# Patient Record
Sex: Female | Born: 1935 | Race: White | Hispanic: No | Smoking: Current every day smoker
Health system: Southern US, Community
[De-identification: ages and names within clinical notes are randomized; demographics above are authoritative.]

## PROBLEM LIST (undated history)

## (undated) DIAGNOSIS — M549 Dorsalgia, unspecified: Secondary | ICD-10-CM

## (undated) DIAGNOSIS — K7460 Unspecified cirrhosis of liver: Secondary | ICD-10-CM

## (undated) DIAGNOSIS — I10 Essential (primary) hypertension: Secondary | ICD-10-CM

---

## 2001-11-08 ENCOUNTER — Encounter: Payer: Self-pay | Admitting: Family Medicine

## 2001-11-08 ENCOUNTER — Ambulatory Visit (HOSPITAL_COMMUNITY): Admission: RE | Admit: 2001-11-08 | Discharge: 2001-11-08 | Payer: Self-pay | Admitting: Family Medicine

## 2001-11-30 ENCOUNTER — Ambulatory Visit (HOSPITAL_COMMUNITY): Admission: RE | Admit: 2001-11-30 | Discharge: 2001-11-30 | Payer: Self-pay | Admitting: Internal Medicine

## 2002-06-08 ENCOUNTER — Ambulatory Visit (HOSPITAL_COMMUNITY): Admission: RE | Admit: 2002-06-08 | Discharge: 2002-06-08 | Payer: Self-pay | Admitting: Family Medicine

## 2002-06-08 ENCOUNTER — Encounter: Payer: Self-pay | Admitting: Family Medicine

## 2003-05-03 ENCOUNTER — Encounter: Payer: Self-pay | Admitting: Family Medicine

## 2003-05-03 ENCOUNTER — Ambulatory Visit (HOSPITAL_COMMUNITY): Admission: RE | Admit: 2003-05-03 | Discharge: 2003-05-03 | Payer: Self-pay | Admitting: Family Medicine

## 2003-05-16 ENCOUNTER — Encounter: Payer: Self-pay | Admitting: Family Medicine

## 2003-05-16 ENCOUNTER — Ambulatory Visit (HOSPITAL_COMMUNITY): Admission: RE | Admit: 2003-05-16 | Discharge: 2003-05-16 | Payer: Self-pay | Admitting: Family Medicine

## 2003-05-25 ENCOUNTER — Other Ambulatory Visit: Admission: RE | Admit: 2003-05-25 | Discharge: 2003-05-25 | Payer: Self-pay | Admitting: Obstetrics & Gynecology

## 2004-07-24 ENCOUNTER — Ambulatory Visit (HOSPITAL_COMMUNITY): Admission: RE | Admit: 2004-07-24 | Discharge: 2004-07-24 | Payer: Self-pay | Admitting: Obstetrics & Gynecology

## 2005-07-17 ENCOUNTER — Ambulatory Visit (HOSPITAL_COMMUNITY): Admission: RE | Admit: 2005-07-17 | Discharge: 2005-07-17 | Payer: Self-pay | Admitting: Family Medicine

## 2005-12-15 ENCOUNTER — Ambulatory Visit (HOSPITAL_COMMUNITY): Admission: RE | Admit: 2005-12-15 | Discharge: 2005-12-15 | Payer: Self-pay | Admitting: Family Medicine

## 2005-12-15 ENCOUNTER — Encounter: Payer: Self-pay | Admitting: Orthopedic Surgery

## 2005-12-16 ENCOUNTER — Ambulatory Visit (HOSPITAL_COMMUNITY): Admission: RE | Admit: 2005-12-16 | Discharge: 2005-12-16 | Payer: Self-pay | Admitting: Family Medicine

## 2005-12-18 ENCOUNTER — Ambulatory Visit (HOSPITAL_COMMUNITY): Admission: RE | Admit: 2005-12-18 | Discharge: 2005-12-18 | Payer: Self-pay | Admitting: Family Medicine

## 2006-10-05 ENCOUNTER — Ambulatory Visit (HOSPITAL_COMMUNITY): Admission: RE | Admit: 2006-10-05 | Discharge: 2006-10-05 | Payer: Self-pay | Admitting: Family Medicine

## 2007-03-30 ENCOUNTER — Emergency Department (HOSPITAL_COMMUNITY): Admission: EM | Admit: 2007-03-30 | Discharge: 2007-03-30 | Payer: Self-pay | Admitting: Emergency Medicine

## 2007-05-31 ENCOUNTER — Emergency Department (HOSPITAL_COMMUNITY): Admission: EM | Admit: 2007-05-31 | Discharge: 2007-05-31 | Payer: Self-pay | Admitting: Emergency Medicine

## 2007-08-15 ENCOUNTER — Encounter: Payer: Self-pay | Admitting: Orthopedic Surgery

## 2007-08-15 ENCOUNTER — Ambulatory Visit (HOSPITAL_COMMUNITY): Admission: RE | Admit: 2007-08-15 | Discharge: 2007-08-15 | Payer: Self-pay | Admitting: Family Medicine

## 2007-08-30 ENCOUNTER — Encounter: Payer: Self-pay | Admitting: Orthopedic Surgery

## 2007-09-12 ENCOUNTER — Ambulatory Visit: Payer: Self-pay | Admitting: Orthopedic Surgery

## 2007-09-12 DIAGNOSIS — M549 Dorsalgia, unspecified: Secondary | ICD-10-CM | POA: Insufficient documentation

## 2007-09-12 DIAGNOSIS — M5430 Sciatica, unspecified side: Secondary | ICD-10-CM

## 2007-09-12 DIAGNOSIS — E119 Type 2 diabetes mellitus without complications: Secondary | ICD-10-CM

## 2007-09-12 DIAGNOSIS — M545 Low back pain, unspecified: Secondary | ICD-10-CM | POA: Insufficient documentation

## 2007-09-12 DIAGNOSIS — Z8679 Personal history of other diseases of the circulatory system: Secondary | ICD-10-CM | POA: Insufficient documentation

## 2007-09-13 ENCOUNTER — Telehealth: Payer: Self-pay | Admitting: Orthopedic Surgery

## 2007-09-16 ENCOUNTER — Encounter: Payer: Self-pay | Admitting: Orthopedic Surgery

## 2007-09-22 ENCOUNTER — Ambulatory Visit: Payer: Self-pay | Admitting: Orthopedic Surgery

## 2007-09-27 ENCOUNTER — Telehealth: Payer: Self-pay | Admitting: Orthopedic Surgery

## 2007-10-26 ENCOUNTER — Encounter: Payer: Self-pay | Admitting: Orthopedic Surgery

## 2008-03-15 ENCOUNTER — Ambulatory Visit (HOSPITAL_COMMUNITY): Admission: RE | Admit: 2008-03-15 | Discharge: 2008-03-15 | Payer: Self-pay | Admitting: Family Medicine

## 2008-11-24 ENCOUNTER — Emergency Department (HOSPITAL_COMMUNITY): Admission: EM | Admit: 2008-11-24 | Discharge: 2008-11-24 | Payer: Self-pay | Admitting: Emergency Medicine

## 2008-11-24 ENCOUNTER — Encounter: Payer: Self-pay | Admitting: Orthopedic Surgery

## 2008-11-28 ENCOUNTER — Ambulatory Visit: Payer: Self-pay | Admitting: Orthopedic Surgery

## 2008-11-28 DIAGNOSIS — S8263XA Displaced fracture of lateral malleolus of unspecified fibula, initial encounter for closed fracture: Secondary | ICD-10-CM | POA: Insufficient documentation

## 2008-11-30 ENCOUNTER — Encounter: Payer: Self-pay | Admitting: Orthopedic Surgery

## 2009-01-09 ENCOUNTER — Ambulatory Visit: Payer: Self-pay | Admitting: Orthopedic Surgery

## 2009-02-20 ENCOUNTER — Ambulatory Visit: Payer: Self-pay | Admitting: Orthopedic Surgery

## 2009-02-20 DIAGNOSIS — M722 Plantar fascial fibromatosis: Secondary | ICD-10-CM

## 2009-02-20 DIAGNOSIS — Q6650 Congenital pes planus, unspecified foot: Secondary | ICD-10-CM

## 2009-02-21 ENCOUNTER — Ambulatory Visit (HOSPITAL_COMMUNITY): Admission: RE | Admit: 2009-02-21 | Discharge: 2009-02-21 | Payer: Self-pay | Admitting: Family Medicine

## 2009-07-26 ENCOUNTER — Ambulatory Visit (HOSPITAL_COMMUNITY): Admission: RE | Admit: 2009-07-26 | Discharge: 2009-07-26 | Payer: Self-pay | Admitting: Family Medicine

## 2009-11-12 ENCOUNTER — Ambulatory Visit (HOSPITAL_COMMUNITY): Admission: RE | Admit: 2009-11-12 | Discharge: 2009-11-12 | Payer: Self-pay | Admitting: Family Medicine

## 2010-09-28 ENCOUNTER — Encounter: Payer: Self-pay | Admitting: Family Medicine

## 2010-10-09 ENCOUNTER — Encounter (HOSPITAL_COMMUNITY): Payer: Self-pay

## 2010-10-09 ENCOUNTER — Other Ambulatory Visit (HOSPITAL_COMMUNITY): Payer: Self-pay | Admitting: Family Medicine

## 2010-10-09 ENCOUNTER — Ambulatory Visit (HOSPITAL_COMMUNITY)
Admission: RE | Admit: 2010-10-09 | Discharge: 2010-10-09 | Disposition: A | Discharge status: Home / Self Care | Payer: Medicare Other | Source: Ambulatory Visit | Attending: Family Medicine | Admitting: Family Medicine

## 2010-10-09 DIAGNOSIS — M5137 Other intervertebral disc degeneration, lumbosacral region: Secondary | ICD-10-CM | POA: Insufficient documentation

## 2010-10-09 DIAGNOSIS — M79609 Pain in unspecified limb: Secondary | ICD-10-CM | POA: Insufficient documentation

## 2010-10-09 DIAGNOSIS — G8929 Other chronic pain: Secondary | ICD-10-CM

## 2010-10-09 DIAGNOSIS — M899 Disorder of bone, unspecified: Secondary | ICD-10-CM | POA: Insufficient documentation

## 2010-10-09 DIAGNOSIS — M545 Low back pain, unspecified: Secondary | ICD-10-CM | POA: Insufficient documentation

## 2010-10-09 DIAGNOSIS — M51379 Other intervertebral disc degeneration, lumbosacral region without mention of lumbar back pain or lower extremity pain: Secondary | ICD-10-CM | POA: Insufficient documentation

## 2010-10-09 HISTORY — DX: Essential (primary) hypertension: I10

## 2011-01-23 NOTE — Op Note (Signed)
[facility]  Patient:    Melanie Fields, Melanie Fields Visit Number: [id number] MRN: [id number]          Service Type: END Location: DAY Attending Physician:  Jonathon Bellows Dictated by:   Roetta Sessions, M.D. Proc. Date: 11/30/01 Admit Date:  11/30/2001   CC:         Butch Penny, M.D.   Operative Report  PROCEDURE:  Colonoscopy and snare polypectomy with biopsy injection therapy.  ENDOSCOPIST:  Roetta Sessions, M.D.  INDICATIONS:  The patient is a 77 year old lady who has never had her lower GI tract imaged and now comes for colonoscopy.  She has chronic right lower quadrant abdominal pain, etiology of which has not been determined.  This procedure has been discussed with her previously.  The potential risks, benefits and alternatives have been reviewed and questions answered.  She is agreeable.  Please see my dictation for more information.  The CBC and urinalysis were normal recently through the office.  The patient is low risk for conscious sedation.  DESCRIPTION OF PROCEDURE:  Oxygen saturation, blood pressure, pulse and respirations were monitored throughout the entire procedure.  Conscious sedation with Versed 6 mg IV in divided doses and Demerol 125 mg IV in divided doses.  The instrument was a pediatric colonoscope and Olympus adult colonoscope.  The pediatric scope was the only one available initially given the number of procedures that were being done today.  Digital rectal examination revealed no abnormalities.  Endoscopic findings: The prep was adequate.  Examination of the rectal mucosa including a retroflexed view of the anal verge revealed a 1 cm polyp at 5 cm and she had a second 1 cm polyp at the rectosigmoid junction at 25 cm.  She had numerous other 0.5 cm and 1 cm polyps throughout the descending and right colon.  There was quite a bit of redundancy of the colon.  I initially utilized the pediatric colonoscope but then felt the adult scope  would be better because of a higher degree of stiffness in reaching the cecum.  I withdrew the pediatric colonoscope and placed the adult scope in the rectum and advanced the scope without much difficulty all the way to the cecum.  The cecum, ileocecal valve, appendiceal valve and orifice were well seen and photographed.  There were two diminutive polyps in the cecum which were cold biopsied/removed.  There were multiple 0.5 to 1 cm polyps in the right colon around the hepatic flexure and descending colon, rectosigmoid and rectum which were removed with snare cautery and recovered.  The polyp at 25 cm did want to ooze after it was resected.  I touched it up with the tip of the cautery unit.  It wanted to bleed initially.  I injected a total of 4 cc of 1:10,000 epinephrine to this area, washed it and applied snare cautery to a couple of small oozing sites. I observed this polypectomy site for a good five minutes afterwards and there appeared to be good hemostasis.  The patient overall tolerated the somewhat prolonged procedure very well and was reactive in endoscopy.  IMPRESSION: 1. Pancolonic polyps and rectal polyps as described above, resected and/or    biopsied as described above.  Polyp at 25 cm oozed post polypectomy    requiring injection therapy and additional cauterization. 2. No endoscopic explanation for the patients right lower quadrant abdominal    pain.  She may ultimately come to laparoscopy with incidental appendectomy.  RECOMMENDATIONS: 1. She was admonished not  to take any aspirin or arthritis medications    for the next 10 days. 2. Colace 100 mg orally b.i.d. for the next five days. 3. If she has any rectal bleeding she is to let me know. 4. Follow up on pathology.  Further recommendations to follow. Dictated by:   Roetta Sessions, M.D. Attending Physician:  Jonathon Bellows DD:  11/30/01 TD:  12/01/01 Job: [id number]

## 2011-01-23 NOTE — Consult Note (Signed)
[facility]  Patient:    CHANYA, CHRISLEY Visit Number: [id number] MRN: [id number]          Service Type: END Location: DAY Attending Physician:  Jonathon Bellows Dictated by:   Roetta Sessions, M.D. Proc. Date: 11/21/01 Admit Date:  11/30/2001 Discharge Date: 11/30/2001   CC:         Butch Penny, M.D.   Consultation Report  DATE OF BIRTH:  July 02, 1934  REASON FOR CONSULTATION:  Right lower quadrant and right flank pain, abnormal CT.  HISTORY OF PRESENT ILLNESS:  Ms. Quorra Rosene is a pleasant 77 year old lady referred at the courtesy of Dr. Butch Penny for a one-year history of right lower quadrant and right flank pain.  She noted it approximately one year ago and has been intermittent ever since, not effected by eating or by having a bowel movement.  She tells me that the pain sometimes radiates from her right back into the front.  She has not had any nausea or vomiting.  Has not lost any weight.  There is no melena or rectal bleeding.  A CT scan from November 08, 2001, demonstrated nonspecific periportal and celiac adenopathy, of uncertain etiology.  There was a small right renal cyst and two left adrenal nodules.  A CT of the pelvis demonstrated a normal-appearing appendix.  No evidence of nephrolithiasis.  A small cyst in the right ovary.  The remainder of the pelvis appeared to be normal.  She tells me that the pain sometimes is worse in the right lower quadrant and right flank after she stands or walks for a prolonged period of time.  She tells me she has had repeatedly normal urinalyses in Dr. Renard Matter office.  She denies any reflux symptoms.  No odynophagia, no dysphagia, or early satiety, nausea, or vomiting.  PAST MEDICAL HISTORY 1. Significant for hypertension. 2. Anxiety. 3. Neurosis.  PAST SURGICAL HISTORY:  None.  CURRENT MEDICATIONS 1. Atenolol 50 mg p.o. q.d. 2. Lortab 5/500, one q.i.d. p.r.n. 3. Ativan 1 mg  p.r.n.  ALLERGIES:  PENICILLIN AND SULFA.  FAMILY HISTORY:  Father died with a myocardial infarction.  Mother died with a brain tumor.  No history of GI neoplasia, or other chronic gastrointestinal illnesses.  SOCIAL HISTORY:  The patient has been married for 50 years.  She has one daughter.  She is retired.  She smokes 1/2 pack of cigarettes per day.  She does not use alcohol.  REVIEW OF SYSTEMS:  Denies a rash.  No chest pain or dyspnea on exertion.  No fever or chills.  PHYSICAL EXAMINATION  GENERAL:  A pleasant 77 year old lady resting comfortably.  Weight 207-1/2 pounds, height 5 feet 3 inches.  VITAL SIGNS:  Temperature 98.3 degrees, blood pressure 156/90, pulse 76.  SKIN:  Warm and dry.  HEENT:  There is no jaundice.  No cutaneous stigmata of chronic liver disease. No scleral icterus.  Conjunctivae are pink.  Oral cavity:  Edentulous.  She has plates in place.  No lesions.  NECK:  Jugular venous distention is not prominent.  CHEST:  Lungs are clear to auscultation.  CARDIAC:  A regular rate and rhythm without murmur, gallop, or rub.  BREASTS:  Examination is deferred.  ABDOMEN:  Nondistended, obese, with positive bowel sounds.  Does have fullness in the right upper quadrant.  No discrete mass or organomegaly.  She does have some right lower quadrant tenderness to palpation.  There is no rebound tenderness.  EXTREMITIES:  No edema.  RECTAL:  Small hemorrhoidal tag, good sphincter tone.  Soft brown stool in the rectal vault,  Hemoccult-negative.  ASSESSMENT:  Ms. Osa Fogarty is a pleasant 77 year old lady with a nearly one-year history of right lower quadrant abdominal pain.  There is some radiation from the right flank into the right lower quadrant.  Prior urinalyses have been negative.  A CT demonstrated some mild adenopathy, which is quite nonspecific and of uncertain significance at this time.  The symptoms have been ongoing too long for this to be herpes  zoster infection. Degenerative joint disease of the spine, producing radicular pain, remains in the differential, at least a contributing factor.  An occult lesion such as a spigelian hernia also remains in the differential. The appendix appear normal on CT scan.  A small cyst in the right ovary is not likely, accounting for all of her symptoms.  RECOMMENDATION:  This lady really needs to have her colon imaged.  She is far overdue, and of course a screening, to further evaluate her symptoms. Will check a urinalysis, CBC, and sedimentation rate.  Will proceed with a colonoscopy.  I have discussed this approach with Ms. Proffit.  The potential risks, benefits, and alternatives have been reviewed.  She is agreeable for conscious sedation.  She may end up getting an MRI of her spine as well. Further recommendations to follow.  I would like to thank Dr. Butch Penny for allowing me to see this nice lady today in consultation. Dictated by:   Roetta Sessions, M.D. Attending Physician:  Jonathon Bellows DD:  11/21/01 TD:  11/21/01 Job: [id number]

## 2011-04-04 ENCOUNTER — Emergency Department (HOSPITAL_COMMUNITY): Payer: Medicare Other

## 2011-04-04 ENCOUNTER — Emergency Department (HOSPITAL_COMMUNITY)
Admission: EM | Admit: 2011-04-04 | Discharge: 2011-04-04 | Disposition: A | Discharge status: Home / Self Care | Payer: Medicare Other | Attending: Emergency Medicine | Admitting: Emergency Medicine

## 2011-04-04 ENCOUNTER — Encounter (HOSPITAL_COMMUNITY): Payer: Self-pay | Admitting: *Deleted

## 2011-04-04 DIAGNOSIS — F17200 Nicotine dependence, unspecified, uncomplicated: Secondary | ICD-10-CM | POA: Insufficient documentation

## 2011-04-04 DIAGNOSIS — E119 Type 2 diabetes mellitus without complications: Secondary | ICD-10-CM | POA: Insufficient documentation

## 2011-04-04 DIAGNOSIS — M549 Dorsalgia, unspecified: Secondary | ICD-10-CM | POA: Insufficient documentation

## 2011-04-04 DIAGNOSIS — G8929 Other chronic pain: Secondary | ICD-10-CM | POA: Insufficient documentation

## 2011-04-04 DIAGNOSIS — R05 Cough: Secondary | ICD-10-CM | POA: Insufficient documentation

## 2011-04-04 DIAGNOSIS — R059 Cough, unspecified: Secondary | ICD-10-CM | POA: Insufficient documentation

## 2011-04-04 DIAGNOSIS — Z79899 Other long term (current) drug therapy: Secondary | ICD-10-CM | POA: Insufficient documentation

## 2011-04-04 DIAGNOSIS — I10 Essential (primary) hypertension: Secondary | ICD-10-CM | POA: Insufficient documentation

## 2011-04-04 HISTORY — DX: Unspecified cirrhosis of liver: K74.60

## 2011-04-04 HISTORY — DX: Dorsalgia, unspecified: M54.9

## 2011-04-04 LAB — URINALYSIS, ROUTINE W REFLEX MICROSCOPIC
Bilirubin Urine: NEGATIVE
Glucose, UA: NEGATIVE mg/dL
Hgb urine dipstick: NEGATIVE
Specific Gravity, Urine: <1.005 — ABNORMAL LOW (ref 1.005–1.030)
pH: 6.5 (ref 5.0–8.0)

## 2011-04-04 MED ORDER — OXYCODONE HCL 5 MG PO TABS
10.0000 mg | ORAL_TABLET | Freq: Once | ORAL | Status: AC
  Administered 2011-04-04: 10 mg via ORAL
  Filled 2011-04-04: qty 2

## 2011-04-04 MED ORDER — TETANUS-DIPHTH-ACELL PERTUSSIS 5-2.5-18.5 LF-MCG/0.5 IM SUSP
0.5000 mL | Freq: Once | INTRAMUSCULAR | Status: AC
  Administered 2011-04-04: 0.5 mL via INTRAMUSCULAR
  Filled 2011-04-04: qty 0.5

## 2011-04-04 MED ORDER — OXYCODONE HCL 5 MG PO CAPS: 5.0000 mg | ORAL_CAPSULE | ORAL | Status: AC | PRN

## 2011-04-04 NOTE — ED Notes (Signed)
C/o pain in right lower quadrant, area is firm to touch and very tender

## 2011-04-04 NOTE — ED Notes (Signed)
Radiology tech notified nurse of tick noted on left upper back. Tick is engaged in skin with very small area of redness noted.  Dr Ethelda Chick notified of finding.

## 2011-04-04 NOTE — ED Notes (Signed)
MD notified of room air pulse ox reading of 97. Md also shown tick that was removed from pt's back with hemostats. Tick was intact when removed. Pt and family member educated on s/s of tick bite complications. Both verbalized understanding.

## 2011-04-04 NOTE — ED Notes (Signed)
Patient waiting for evaluation, c/o pain in right lower quadrant

## 2011-04-04 NOTE — ED Notes (Signed)
Oxygen level was checked while on room air. Oxygen level was 97%. Pt states that she is hungry and has not eaten all day. Pt was made aware that she would have to wait for x-rays and labs to come back. NAD noted at this time.

## 2011-04-04 NOTE — ED Notes (Signed)
Short of breath onset yesterday, also states she has cirrhosis, states her breathing is better than yesterday

## 2011-04-04 NOTE — ED Provider Notes (Signed)
Scribed for Dr. Ethelda Chick, the patient was seen in room 7. This chart was scribed by Jannette Fogo. This patient's care was started at 14:19.    Chief Complaint  Patient presents with  . Respiratory Distress    HPI Melanie Fields is a 77 y.o. female who presents to the Emergency Department complaining of exacerbation of chronic back pain and right flank pain. Patient states she's had chronic back pain for "a long time" due to a history of arthritis and fibromyalgia. She denies any recent falls or ,injury. Yesterday patient developed shortness of breath and difficulty breathing which got worse today. She reports an associated slight cough with "white" sputum but currently denies any dyspnea present. Patient has a history of tobacco use for ~15 years and is not on oxygen at home. Denies recent fever, nausea, vomiting, abdominal pain, or difficulty urinating. Patient has a history of liver cirrhosis but denies a history of alcohol use. There are no other associated symptoms and no other alleviating or aggravating factors.     PAST MEDICAL HISTORY:  Past Medical History  Diagnosis Date  . Diabetes mellitus   . Hypertension   . Cirrhosis   . Back pain    Arthritis  Fibromyalgia.  MEDICATIONS:  Previous Medications   ALBUTEROL (PROVENTIL HFA) 108 (90 BASE) MCG/ACT INHALER    Inhale 2 puffs into the lungs 2 (two) times daily.     CLOPIDOGREL (PLAVIX) 75 MG TABLET    Take 75 mg by mouth daily.     GLIPIZIDE (GLUCOTROL XL) 10 MG 24 HR TABLET    Take 10 mg by mouth daily.     HYDROCODONE-ACETAMINOPHEN (NORCO) 10-325 MG PER TABLET    Take 1 tablet by mouth every 4 (four) hours as needed. Pain    VALSARTAN (DIOVAN) 80 MG TABLET    Take 80 mg by mouth daily.       ALLERGIES:  Allergies as of 04/04/2011 - Review Complete 04/04/2011  Allergen Reaction Noted  . Sulfonamide derivatives Itching   . Penicillins Rash      FAMILY HISTORY:  No pertinent family history.    SOCIAL HISTORY: Uses a  walker to ambulate.  History  Substance Use Topics  . Smoking status: Current Everyday Smoker -- 0.5 packs/day  . Smokeless tobacco: Not on file  . Alcohol Use: No    OB History    Grav Para Term Preterm Abortions TAB SAB Ect Mult Living                  Review of Systems  Constitutional: Negative.  Negative for fever.  HENT: Negative.   Respiratory: Positive for cough ("white" sputum ).   Cardiovascular: Negative.   Gastrointestinal: Negative.  Negative for nausea, vomiting and abdominal pain.  Genitourinary: Negative for dysuria.  Musculoskeletal: Positive for back pain (chronic ).  Skin: Negative.   Neurological: Negative.   Hematological: Negative.   Psychiatric/Behavioral: Negative.   All other systems reviewed and are negative.    Physical Exam  BP 120/62  Pulse 80  Temp(Src) 99 F (37.2 C) (Rectal)  Resp 16  Ht 5\' 6"  (1.676 m)  Wt 175 lb (79.379 kg)  BMI 28.25 kg/m2  SpO2 97%  Physical Exam  Constitutional: She is oriented to person, place, and time.       Chronically ill appearing.   HENT:  Head: Normocephalic and atraumatic.       Mucous membranes are moist.   Eyes: Conjunctivae are normal. Pupils are  equal, round, and reactive to light.  Neck: Neck supple. No tracheal deviation present. No thyromegaly present.  Cardiovascular: Normal rate and regular rhythm.   No murmur heard. Pulmonary/Chest: Effort normal and breath sounds normal. She exhibits tenderness (mild tenderness right posterior chest wall/ thorax. No crepitance. ).  Abdominal: Soft. She exhibits no distension. There is no tenderness.  Musculoskeletal: Normal range of motion. She exhibits no edema and no tenderness.       Moves all extremities. All four extremities with gross atrophy.   Neurological: She is alert and oriented to person, place, and time. No cranial nerve deficit (Cranial nerves 2-12 grossly intact. ). Coordination normal.       Grossly intact.   Skin: Skin is warm and dry.  No rash noted.  Psychiatric: She has a normal mood and affect.    OTHER DATA REVIEWED: Nursing notes, vital signs, and past medical records reviewed.  DIAGNOSTIC STUDIES: Oxygen Saturation is 97% on room air, normal by my interpretation.     RADIOLOGY:  Results for orders placed during the hospital encounter of 04/04/11  URINALYSIS, ROUTINE W REFLEX MICROSCOPIC      Component Value Range   Color, Urine STRAW (*) YELLOW    Appearance CLEAR  CLEAR    Specific Gravity, Urine <1.005 (*) 1.005 - 1.030    pH 6.5  5.0 - 8.0    Glucose, UA NEGATIVE  NEGATIVE (mg/dL)   Hgb urine dipstick NEGATIVE  NEGATIVE    Bilirubin Urine NEGATIVE  NEGATIVE    Ketones, ur NEGATIVE  NEGATIVE (mg/dL)   Protein, ur NEGATIVE  NEGATIVE (mg/dL)   Urobilinogen, UA 0.2  0.0 - 1.0 (mg/dL)   Nitrite NEGATIVE  NEGATIVE    Leukocytes, UA NEGATIVE  NEGATIVE     CXR: 2 View; Interpreted by Radiologist Dr. Denton Meek and reviewed by me.  1. No acute cardiopulmonary abnormalities. 2. Interstitial coarsening. 3. Thoracic spondylosis.   ED COURSE / COORDINATION OF CARE: 17:35 - ED nurse emoved a tick from the patient's left upper back. On re-examination by Dr. Ethelda Chick the tick was removed in full and the site revealed a clean wound. Patient stable for discharge.    MDM: Patient's pain is chronic Plan prescription OxyIR patient to follow up with Dr. Ardelle Balls in 2 days for refills. she has run out of hydrocodone with acetaminophen. Suggest referral to pain clinic. Note dyspnea on a book on arrival has resolved patient's breathing normal. Shortly after arrival without oxygen and without treatment  IMPRESSION: Diagnoses that have been ruled out:  Diagnoses that are still under consideration:  Final diagnoses:     PLAN: Discharge  The patient is to return the emergency department if there is any worsening of symptoms. I have reviewed the discharge instructions with the patient.    CONDITION ON  DISCHARGE: Stable    MEDICATIONS GIVEN IN THE E.D.  Medications  HYDROcodone-acetaminophen (NORCO) 10-325 MG per tablet (not administered)  clopidogrel (PLAVIX) 75 MG tablet (not administered)  valsartan (DIOVAN) 80 MG tablet (not administered)  glipiZIDE (GLUCOTROL XL) 10 MG 24 hr tablet (not administered)  albuterol (PROVENTIL HFA) 108 (90 BASE) MCG/ACT inhaler (not administered)  oxyCODONE (Oxy IR/ROXICODONE) immediate release tablet 10 mg (10 mg Oral Given 04/04/11 1451)     DISCHARGE MEDICATIONS: New Prescriptions   No medications on file    Procedures  I personally performed the services described in this documentation, which was scribed in my presence.  The recorded information has been  reviewed and considered.     Doug Sou, MD 04/04/11 1610

## 2011-06-22 LAB — URINALYSIS, ROUTINE W REFLEX MICROSCOPIC
Glucose, UA: 100 — AB
Hgb urine dipstick: NEGATIVE
Specific Gravity, Urine: 1.02
Urobilinogen, UA: 0.2
pH: 7

## 2011-06-22 LAB — URINE MICROSCOPIC-ADD ON

## 2012-03-25 ENCOUNTER — Ambulatory Visit (HOSPITAL_COMMUNITY)
Admission: RE | Admit: 2012-03-25 | Discharge: 2012-03-25 | Disposition: A | Discharge status: Home / Self Care | Payer: Medicare Other | Source: Ambulatory Visit | Attending: Family Medicine | Admitting: Family Medicine

## 2012-03-25 ENCOUNTER — Other Ambulatory Visit (HOSPITAL_COMMUNITY): Payer: Self-pay | Admitting: Family Medicine

## 2012-03-25 DIAGNOSIS — M25559 Pain in unspecified hip: Secondary | ICD-10-CM | POA: Insufficient documentation

## 2012-03-25 DIAGNOSIS — M5137 Other intervertebral disc degeneration, lumbosacral region: Secondary | ICD-10-CM | POA: Insufficient documentation

## 2012-03-25 DIAGNOSIS — M545 Low back pain, unspecified: Secondary | ICD-10-CM | POA: Insufficient documentation

## 2012-03-25 DIAGNOSIS — M51379 Other intervertebral disc degeneration, lumbosacral region without mention of lumbar back pain or lower extremity pain: Secondary | ICD-10-CM | POA: Insufficient documentation

## 2012-03-25 DIAGNOSIS — M1610 Unilateral primary osteoarthritis, unspecified hip: Secondary | ICD-10-CM | POA: Insufficient documentation

## 2012-03-25 DIAGNOSIS — M169 Osteoarthritis of hip, unspecified: Secondary | ICD-10-CM | POA: Insufficient documentation

## 2012-07-28 ENCOUNTER — Other Ambulatory Visit (HOSPITAL_COMMUNITY): Payer: Self-pay | Admitting: Family Medicine

## 2012-07-28 DIAGNOSIS — Z139 Encounter for screening, unspecified: Secondary | ICD-10-CM

## 2012-08-02 ENCOUNTER — Ambulatory Visit (HOSPITAL_COMMUNITY): Payer: Medicare Other

## 2014-09-09 ENCOUNTER — Emergency Department (HOSPITAL_COMMUNITY)
Admission: EM | Admit: 2014-09-09 | Discharge: 2014-09-09 | Disposition: A | Discharge status: Home / Self Care | Payer: Medicare Other | Attending: Emergency Medicine | Admitting: Emergency Medicine

## 2014-09-09 ENCOUNTER — Encounter (HOSPITAL_COMMUNITY): Payer: Self-pay | Admitting: Emergency Medicine

## 2014-09-09 ENCOUNTER — Emergency Department (HOSPITAL_COMMUNITY): Payer: Medicare Other

## 2014-09-09 DIAGNOSIS — R51 Headache: Secondary | ICD-10-CM | POA: Diagnosis not present

## 2014-09-09 DIAGNOSIS — R197 Diarrhea, unspecified: Secondary | ICD-10-CM

## 2014-09-09 DIAGNOSIS — R911 Solitary pulmonary nodule: Secondary | ICD-10-CM | POA: Diagnosis not present

## 2014-09-09 DIAGNOSIS — J449 Chronic obstructive pulmonary disease, unspecified: Secondary | ICD-10-CM

## 2014-09-09 DIAGNOSIS — E119 Type 2 diabetes mellitus without complications: Secondary | ICD-10-CM | POA: Insufficient documentation

## 2014-09-09 DIAGNOSIS — Z8719 Personal history of other diseases of the digestive system: Secondary | ICD-10-CM | POA: Diagnosis not present

## 2014-09-09 DIAGNOSIS — R918 Other nonspecific abnormal finding of lung field: Secondary | ICD-10-CM

## 2014-09-09 DIAGNOSIS — R0902 Hypoxemia: Secondary | ICD-10-CM | POA: Diagnosis not present

## 2014-09-09 DIAGNOSIS — R0600 Dyspnea, unspecified: Secondary | ICD-10-CM

## 2014-09-09 DIAGNOSIS — I10 Essential (primary) hypertension: Secondary | ICD-10-CM | POA: Diagnosis not present

## 2014-09-09 DIAGNOSIS — J441 Chronic obstructive pulmonary disease with (acute) exacerbation: Secondary | ICD-10-CM | POA: Insufficient documentation

## 2014-09-09 DIAGNOSIS — R112 Nausea with vomiting, unspecified: Secondary | ICD-10-CM | POA: Diagnosis present

## 2014-09-09 DIAGNOSIS — Z720 Tobacco use: Secondary | ICD-10-CM | POA: Insufficient documentation

## 2014-09-09 DIAGNOSIS — Z79899 Other long term (current) drug therapy: Secondary | ICD-10-CM | POA: Diagnosis not present

## 2014-09-09 DIAGNOSIS — R634 Abnormal weight loss: Secondary | ICD-10-CM | POA: Insufficient documentation

## 2014-09-09 DIAGNOSIS — Z7902 Long term (current) use of antithrombotics/antiplatelets: Secondary | ICD-10-CM | POA: Insufficient documentation

## 2014-09-09 LAB — CBC WITH DIFFERENTIAL/PLATELET
Basophils Absolute: 0 10*3/uL (ref 0.0–0.1)
Basophils Relative: 0 % (ref 0–1)
EOS ABS: 0 10*3/uL (ref 0.0–0.7)
EOS PCT: 0 % (ref 0–5)
HEMATOCRIT: 51.7 % — AB (ref 36.0–46.0)
Hemoglobin: 17.5 g/dL — ABNORMAL HIGH (ref 12.0–15.0)
LYMPHS ABS: 0.9 10*3/uL (ref 0.7–4.0)
LYMPHS PCT: 8 % — AB (ref 12–46)
MCH: 32.8 pg (ref 26.0–34.0)
MCHC: 33.8 g/dL (ref 30.0–36.0)
MCV: 96.8 fL (ref 78.0–100.0)
MONO ABS: 0.4 10*3/uL (ref 0.1–1.0)
Monocytes Relative: 4 % (ref 3–12)
Neutro Abs: 10.3 10*3/uL — ABNORMAL HIGH (ref 1.7–7.7)
Neutrophils Relative %: 88 % — ABNORMAL HIGH (ref 43–77)
PLATELETS: 161 10*3/uL (ref 150–400)
RBC: 5.34 MIL/uL — ABNORMAL HIGH (ref 3.87–5.11)
RDW: 13.7 % (ref 11.5–15.5)
WBC: 11.7 10*3/uL — AB (ref 4.0–10.5)

## 2014-09-09 LAB — TROPONIN I: Troponin I: <0.03 ng/mL (ref <0.031)

## 2014-09-09 LAB — BRAIN NATRIURETIC PEPTIDE: B NATRIURETIC PEPTIDE 5: 615 pg/mL — AB (ref 0.0–100.0)

## 2014-09-09 LAB — URINALYSIS, ROUTINE W REFLEX MICROSCOPIC
BILIRUBIN URINE: NEGATIVE
GLUCOSE, UA: NEGATIVE mg/dL
HGB URINE DIPSTICK: NEGATIVE
KETONES UR: 15 mg/dL — AB
Nitrite: NEGATIVE
PH: 6.5 (ref 5.0–8.0)
PROTEIN: 30 mg/dL — AB
Specific Gravity, Urine: 1.02 (ref 1.005–1.030)
Urobilinogen, UA: 0.2 mg/dL (ref 0.0–1.0)

## 2014-09-09 LAB — BLOOD GAS, ARTERIAL
Acid-base deficit: 1.2 mmol/L (ref 0.0–2.0)
Bicarbonate: 22.5 mEq/L (ref 20.0–24.0)
O2 Content: 3 L/min
O2 SAT: 93.9 %
PCO2 ART: 34.6 mmHg — AB (ref 35.0–45.0)
PH ART: 7.429 (ref 7.350–7.450)
PO2 ART: 63.4 mmHg — AB (ref 80.0–100.0)
Patient temperature: 37
TCO2: 19 mmol/L (ref 0–100)

## 2014-09-09 LAB — HEPATIC FUNCTION PANEL
ALK PHOS: 70 U/L (ref 39–117)
ALT: 11 U/L (ref 0–35)
AST: 21 U/L (ref 0–37)
Albumin: 4.2 g/dL (ref 3.5–5.2)
BILIRUBIN DIRECT: 0.2 mg/dL (ref 0.0–0.3)
BILIRUBIN INDIRECT: 0.7 mg/dL (ref 0.3–0.9)
BILIRUBIN TOTAL: 0.9 mg/dL (ref 0.3–1.2)
Total Protein: 7.3 g/dL (ref 6.0–8.3)

## 2014-09-09 LAB — URINE MICROSCOPIC-ADD ON

## 2014-09-09 LAB — BASIC METABOLIC PANEL
Anion gap: 8 (ref 5–15)
BUN: 10 mg/dL (ref 6–23)
CALCIUM: 10.6 mg/dL — AB (ref 8.4–10.5)
CO2: 25 mmol/L (ref 19–32)
CREATININE: 0.97 mg/dL (ref 0.50–1.10)
Chloride: 101 mEq/L (ref 96–112)
GFR calc Af Amer: 63 mL/min — ABNORMAL LOW (ref >90)
GFR, EST NON AFRICAN AMERICAN: 55 mL/min — AB (ref >90)
GLUCOSE: 141 mg/dL — AB (ref 70–99)
Potassium: 3.6 mmol/L (ref 3.5–5.1)
SODIUM: 134 mmol/L — AB (ref 135–145)

## 2014-09-09 LAB — PROTIME-INR
INR: 1.1 (ref 0.00–1.49)
PROTHROMBIN TIME: 14.3 s (ref 11.6–15.2)

## 2014-09-09 LAB — LIPASE, BLOOD: Lipase: 24 U/L (ref 11–59)

## 2014-09-09 MED ORDER — FENTANYL CITRATE 0.05 MG/ML IJ SOLN
50.0000 ug | Freq: Once | INTRAMUSCULAR | Status: AC
  Administered 2014-09-09: 50 ug via INTRAVENOUS
  Filled 2014-09-09: qty 2

## 2014-09-09 MED ORDER — ONDANSETRON HCL 4 MG/2ML IJ SOLN
4.0000 mg | Freq: Once | INTRAMUSCULAR | Status: AC
  Administered 2014-09-09: 4 mg via INTRAVENOUS
  Filled 2014-09-09: qty 2

## 2014-09-09 MED ORDER — IPRATROPIUM-ALBUTEROL 0.5-2.5 (3) MG/3ML IN SOLN
3.0000 mL | Freq: Once | RESPIRATORY_TRACT | Status: AC
  Administered 2014-09-09: 3 mL via RESPIRATORY_TRACT
  Filled 2014-09-09: qty 3

## 2014-09-09 MED ORDER — PREDNISONE 20 MG PO TABS: 60.0000 mg | ORAL_TABLET | Freq: Every day | ORAL | Status: DC

## 2014-09-09 MED ORDER — IOHEXOL 350 MG/ML SOLN
100.0000 mL | Freq: Once | INTRAVENOUS | Status: AC | PRN
  Administered 2014-09-09: 100 mL via INTRAVENOUS

## 2014-09-09 MED ORDER — METHYLPREDNISOLONE SODIUM SUCC 125 MG IJ SOLR
125.0000 mg | Freq: Once | INTRAMUSCULAR | Status: AC
  Administered 2014-09-09: 125 mg via INTRAVENOUS
  Filled 2014-09-09: qty 2

## 2014-09-09 MED ORDER — ONDANSETRON 4 MG PO TBDP: 4.0000 mg | ORAL_TABLET | Freq: Three times a day (TID) | ORAL | Status: DC | PRN

## 2014-09-09 MED ORDER — IOHEXOL 300 MG/ML  SOLN
50.0000 mL | Freq: Once | INTRAMUSCULAR | Status: AC | PRN
  Administered 2014-09-09: 50 mL via ORAL

## 2014-09-09 MED ORDER — LOPERAMIDE HCL 2 MG PO CAPS: 2.0000 mg | ORAL_CAPSULE | Freq: Four times a day (QID) | ORAL | Status: DC | PRN

## 2014-09-09 MED ORDER — IOHEXOL 300 MG/ML  SOLN: 100.0000 mL | Freq: Once | INTRAMUSCULAR | Status: DC | PRN

## 2014-09-09 MED ORDER — ALBUTEROL SULFATE HFA 108 (90 BASE) MCG/ACT IN AERS: 2.0000 | INHALATION_SPRAY | RESPIRATORY_TRACT | Status: DC | PRN

## 2014-09-09 MED ORDER — SODIUM CHLORIDE 0.9 % IV SOLN
INTRAVENOUS | Status: DC
  Administered 2014-09-09: 15:00:00 via INTRAVENOUS

## 2014-09-09 NOTE — Discharge Instructions (Signed)
Chronic Obstructive Pulmonary Disease °Chronic obstructive pulmonary disease (COPD) is a common lung condition in which airflow from the lungs is limited. COPD is a general term that can be used to describe many different lung problems that limit airflow, including both chronic bronchitis and emphysema.  If you have COPD, your lung function will probably never return to normal, but there are measures you can take to improve lung function and make yourself feel better.  °CAUSES  °· Smoking (common).   °· Exposure to secondhand smoke.   °· Genetic problems. °· Chronic inflammatory lung diseases or recurrent infections. °SYMPTOMS  °· Shortness of breath, especially with physical activity.   °· Deep, persistent (chronic) cough with a large amount of thick mucus.   °· Wheezing.   °· Rapid breaths (tachypnea).   °· Gray or bluish discoloration (cyanosis) of the skin, especially in fingers, toes, or lips.   °· Fatigue.   °· Weight loss.   °· Frequent infections or episodes when breathing symptoms become much worse (exacerbations).   °· Chest tightness. °DIAGNOSIS  °Your health care provider will take a medical history and perform a physical examination to make the initial diagnosis.  Additional tests for COPD may include:  °· Lung (pulmonary) function tests. °· Chest X-ray. °· CT scan. °· Blood tests. °TREATMENT  °Treatment available to help you feel better when you have COPD includes:  °· Inhaler and nebulizer medicines. These help manage the symptoms of COPD and make your breathing more comfortable. °· Supplemental oxygen. Supplemental oxygen is only helpful if you have a low oxygen level in your blood.   °· Exercise and physical activity. These are beneficial for nearly all people with COPD. Some people may also benefit from a pulmonary rehabilitation program. °HOME CARE INSTRUCTIONS  °· Take all medicines (inhaled or pills) as directed by your health care provider. °· Avoid over-the-counter medicines or cough syrups  that dry up your airway (such as antihistamines) and slow down the elimination of secretions unless instructed otherwise by your health care provider.   °· If you are a smoker, the most important thing that you can do is stop smoking. Continuing to smoke will cause further lung damage and breathing trouble. Ask your health care provider for help with quitting smoking. He or she can direct you to community resources or hospitals that provide support. °· Avoid exposure to irritants such as smoke, chemicals, and fumes that aggravate your breathing. °· Use oxygen therapy and pulmonary rehabilitation if directed by your health care provider. If you require home oxygen therapy, ask your health care provider whether you should purchase a pulse oximeter to measure your oxygen level at home.   °· Avoid contact with individuals who have a contagious illness. °· Avoid extreme temperature and humidity changes. °· Eat healthy foods. Eating smaller, more frequent meals and resting before meals may help you maintain your strength. °· Stay active, but balance activity with periods of rest. Exercise and physical activity will help you maintain your ability to do things you want to do. °· Preventing infection and hospitalization is very important when you have COPD. Make sure to receive all the vaccines your health care provider recommends, especially the pneumococcal and influenza vaccines. Ask your health care provider whether you need a pneumonia vaccine. °· Learn and use relaxation techniques to manage stress. °· Learn and use controlled breathing techniques as directed by your health care provider. Controlled breathing techniques include:   °· Pursed lip breathing. Start by breathing in (inhaling) through your nose for 1 second. Then, purse your lips as if you were   going to whistle and breathe out (exhale) through the pursed lips for 2 seconds.   Diaphragmatic breathing. Start by putting one hand on your abdomen just above  your waist. Inhale slowly through your nose. The hand on your abdomen should move out. Then purse your lips and exhale slowly. You should be able to feel the hand on your abdomen moving in as you exhale.   Learn and use controlled coughing to clear mucus from your lungs. Controlled coughing is a series of short, progressive coughs. The steps of controlled coughing are:   Lean your head slightly forward.   Breathe in deeply using diaphragmatic breathing.   Try to hold your breath for 3 seconds.   Keep your mouth slightly open while coughing twice.   Spit any mucus out into a tissue.   Rest and repeat the steps once or twice as needed. SEEK MEDICAL CARE IF:   You are coughing up more mucus than usual.   There is a change in the color or thickness of your mucus.   Your breathing is more labored than usual.   Your breathing is faster than usual.  SEEK IMMEDIATE MEDICAL CARE IF:   You have shortness of breath while you are resting.   You have shortness of breath that prevents you from:  Being able to talk.   Performing your usual physical activities.   You have chest pain lasting longer than 5 minutes.   Your skin color is more cyanotic than usual.  You measure low oxygen saturations for longer than 5 minutes with a pulse oximeter. MAKE SURE YOU:   Understand these instructions.  Will watch your condition.  Will get help right away if you are not doing well or get worse. Document Released: 06/03/2005 Document Revised: 01/08/2014 Document Reviewed: 04/20/2013 [facility] Patient Information 2015 [place], [place]. This information is not intended to replace advice given to you by your health care provider. Make sure you discuss any questions you have with your health care provider.  Diarrhea Diarrhea is frequent loose and watery bowel movements. It can cause you to feel weak and dehydrated. Dehydration can cause you to become tired and thirsty, have a dry mouth,  and have decreased urination that often is dark yellow. Diarrhea is a sign of another problem, most often an infection that will not last long. In most cases, diarrhea typically lasts 2-3 days. However, it can last longer if it is a sign of something more serious. It is important to treat your diarrhea as directed by your caregiver to lessen or prevent future episodes of diarrhea. CAUSES  Some common causes include:  Gastrointestinal infections caused by viruses, bacteria, or parasites.  Food poisoning or food allergies.  Certain medicines, such as antibiotics, chemotherapy, and laxatives.  Artificial sweeteners and fructose.  Digestive disorders. HOME CARE INSTRUCTIONS  Ensure adequate fluid intake (hydration): Have 1 cup (8 oz) of fluid for each diarrhea episode. Avoid fluids that contain simple sugars or sports drinks, fruit juices, whole milk products, and sodas. Your urine should be clear or pale yellow if you are drinking enough fluids. Hydrate with an oral rehydration solution that you can purchase at pharmacies, retail stores, and online. You can prepare an oral rehydration solution at home by mixing the following ingredients together:   - tsp table salt.   tsp baking soda.   tsp salt substitute containing potassium chloride.  1  tablespoons sugar.  1 L (34 oz) of water.  Certain foods and beverages may  increase the speed at which food moves through the gastrointestinal (GI) tract. These foods and beverages should be avoided and include:  Caffeinated and alcoholic beverages.  High-fiber foods, such as raw fruits and vegetables, nuts, seeds, and whole grain breads and cereals.  Foods and beverages sweetened with sugar alcohols, such as xylitol, sorbitol, and mannitol.  Some foods may be well tolerated and may help thicken stool including:  Starchy foods, such as rice, toast, pasta, low-sugar cereal, oatmeal, grits, baked potatoes, crackers, and  bagels.  Bananas.  Applesauce.  Add probiotic-rich foods to help increase healthy bacteria in the GI tract, such as yogurt and fermented milk products.  Wash your hands well after each diarrhea episode.  Only take over-the-counter or prescription medicines as directed by your caregiver.  Take a warm bath to relieve any burning or pain from frequent diarrhea episodes. SEEK IMMEDIATE MEDICAL CARE IF:   You are unable to keep fluids down.  You have persistent vomiting.  You have blood in your stool, or your stools are black and tarry.  You do not urinate in 6-8 hours, or there is only a small amount of very dark urine.  You have abdominal pain that increases or localizes.  You have weakness, dizziness, confusion, or light-headedness.  You have a severe headache.  Your diarrhea gets worse or does not get better.  You have a fever or persistent symptoms for more than 2-3 days.  You have a fever and your symptoms suddenly get worse. MAKE SURE YOU:   Understand these instructions.  Will watch your condition.  Will get help right away if you are not doing well or get worse. Document Released: 08/14/2002 Document Revised: 01/08/2014 Document Reviewed: 05/01/2012 [facility] Patient Information 2015 [place], [place]. This information is not intended to replace advice given to you by your health care provider. Make sure you discuss any questions you have with your health care provider.  Nausea and Vomiting Nausea is a sick feeling that often comes before throwing up (vomiting). Vomiting is a reflex where stomach contents come out of your mouth. Vomiting can cause severe loss of body fluids (dehydration). Children and elderly adults can become dehydrated quickly, especially if they also have diarrhea. Nausea and vomiting are symptoms of a condition or disease. It is important to find the cause of your symptoms. CAUSES   Direct irritation of the stomach lining. This irritation can  result from increased acid production (gastroesophageal reflux disease), infection, food poisoning, taking certain medicines (such as nonsteroidal anti-inflammatory drugs), alcohol use, or tobacco use.  Signals from the brain.These signals could be caused by a headache, heat exposure, an inner ear disturbance, increased pressure in the brain from injury, infection, a tumor, or a concussion, pain, emotional stimulus, or metabolic problems.  An obstruction in the gastrointestinal tract (bowel obstruction).  Illnesses such as diabetes, hepatitis, gallbladder problems, appendicitis, kidney problems, cancer, sepsis, atypical symptoms of a heart attack, or eating disorders.  Medical treatments such as chemotherapy and radiation.  Receiving medicine that makes you sleep (general anesthetic) during surgery. DIAGNOSIS Your caregiver may ask for tests to be done if the problems do not improve after a few days. Tests may also be done if symptoms are severe or if the reason for the nausea and vomiting is not clear. Tests may include:  Urine tests.  Blood tests.  Stool tests.  Cultures (to look for evidence of infection).  X-rays or other imaging studies. Test results can help your caregiver make decisions about  treatment or the need for additional tests. TREATMENT You need to stay well hydrated. Drink frequently but in small amounts.You may wish to drink water, sports drinks, clear broth, or eat frozen ice pops or gelatin dessert to help stay hydrated.When you eat, eating slowly may help prevent nausea.There are also some antinausea medicines that may help prevent nausea. HOME CARE INSTRUCTIONS   Take all medicine as directed by your caregiver.  If you do not have an appetite, do not force yourself to eat. However, you must continue to drink fluids.  If you have an appetite, eat a normal diet unless your caregiver tells you differently.  Eat a variety of complex carbohydrates (rice, wheat,  potatoes, bread), lean meats, yogurt, fruits, and vegetables.  Avoid high-fat foods because they are more difficult to digest.  Drink enough water and fluids to keep your urine clear or pale yellow.  If you are dehydrated, ask your caregiver for specific rehydration instructions. Signs of dehydration may include:  Severe thirst.  Dry lips and mouth.  Dizziness.  Dark urine.  Decreasing urine frequency and amount.  Confusion.  Rapid breathing or pulse. SEEK IMMEDIATE MEDICAL CARE IF:   You have blood or brown flecks (like coffee grounds) in your vomit.  You have black or bloody stools.  You have a severe headache or stiff neck.  You are confused.  You have severe abdominal pain.  You have chest pain or trouble breathing.  You do not urinate at least once every 8 hours.  You develop cold or clammy skin.  You continue to vomit for longer than 24 to 48 hours.  You have a fever. MAKE SURE YOU:   Understand these instructions.  Will watch your condition.  Will get help right away if you are not doing well or get worse. Document Released: 08/24/2005 Document Revised: 11/16/2011 Document Reviewed: 01/21/2011 [facility] Patient Information 2015 [place], [place]. This information is not intended to replace advice given to you by your health care provider. Make sure you discuss any questions you have with your health care provider.   Cirrhosis Cirrhosis is a condition of scarring of the liver which is caused when the liver has tried repairing itself following damage. This damage may come from a previous infection such as one of the forms of hepatitis (usually hepatitis C), or the damage may come from being injured by toxins. The main toxin that causes this damage is alcohol. The scarring of the liver from use of alcohol is irreversible. That means the liver cannot return to normal even though alcohol is not used any more. The main danger of hepatitis C infection is that it may  cause long-lasting (chronic) liver disease, and this also may lead to cirrhosis. This complication is progressive and irreversible. CAUSES  Prior to available blood tests, hepatitis C could be contracted by blood transfusions. Since testing of blood has improved, this is now unlikely. This infection can also be contracted through intravenous drug use and the sharing of needles. It can also be contracted through sexual relationships. The injury caused by alcohol comes from too much use. It is not a few drinks that poison the liver, but years of misuse. Usually there will be some signs and symptoms early with scarring of the liver that suggest the development of better habits. Alcohol should never be used while using acetaminophen. A small dose of both taken together may cause irreversible damage to the liver. HOME CARE INSTRUCTIONS  There is no specific treatment for cirrhosis.  However, there are things you can do to avoid making the condition worse.  Rest as needed.  Eat a well-balanced diet. Your caregiver can help you with suggestions.  Vitamin supplements including vitamins A, K, D, and thiamine can help.  A low-salt diet, water restriction, or diuretic medicine may be needed to reduce fluid retention.  Avoid alcohol. This can be extremely toxic if combined with acetaminophen.  Avoid drugs which are toxic to the liver. Some of these include isoniazid, methyldopa, acetaminophen, anabolic steroids (muscle-building drugs), erythromycin, and oral contraceptives (birth control pills). Check with your caregiver to make sure medicines you are presently taking will not be harmful.  Periodic blood tests may be required. Follow your caregiver's advice regarding the timing of these.  Milk thistle is an herbal remedy which does protect the liver against toxins. However, it will not help once the liver has been scarred. SEEK MEDICAL CARE IF:  You have increasing fatigue or weakness.  You develop  swelling of the hands, feet, legs, or face.  You vomit bright red blood, or a coffee ground appearing material.  You have blood in your stools, or the stools turn black and tarry.  You have a fever.  You develop loss of appetite, or have nausea and vomiting.  You develop jaundice.  You develop easy bruising or bleeding.  You have worsening of any of the problems you are concerned about. Document Released: 08/24/2005 Document Revised: 11/16/2011 Document Reviewed: 04/11/2008 [facility] Patient Information 2015 [place], [place]. This information is not intended to replace advice given to you by your health care provider. Make sure you discuss any questions you have with your health care provider.    Vertigo Vertigo means you feel like you or your surroundings are moving when they are not. Vertigo can be dangerous if it occurs when you are at work, driving, or performing difficult activities.  CAUSES  Vertigo occurs when there is a conflict of signals sent to your brain from the visual and sensory systems in your body. There are many different causes of vertigo, including:  Infections, especially in the inner ear.  A bad reaction to a drug or misuse of alcohol and medicines.  Withdrawal from drugs or alcohol.  Rapidly changing positions, such as lying down or rolling over in bed.  A migraine headache.  Decreased blood flow to the brain.  Increased pressure in the brain from a head injury, infection, tumor, or bleeding. SYMPTOMS  You may feel as though the world is spinning around or you are falling to the ground. Because your balance is upset, vertigo can cause nausea and vomiting. You may have involuntary eye movements (nystagmus). DIAGNOSIS  Vertigo is usually diagnosed by physical exam. If the cause of your vertigo is unknown, your caregiver may perform imaging tests, such as an MRI scan (magnetic resonance imaging). TREATMENT  Most cases of vertigo resolve on their own,  without treatment. Depending on the cause, your caregiver may prescribe certain medicines. If your vertigo is related to body position issues, your caregiver may recommend movements or procedures to correct the problem. In rare cases, if your vertigo is caused by certain inner ear problems, you may need surgery. HOME CARE INSTRUCTIONS   Follow your caregiver's instructions.  Avoid driving.  Avoid operating heavy machinery.  Avoid performing any tasks that would be dangerous to you or others during a vertigo episode.  Tell your caregiver if you notice that certain medicines seem to be causing your vertigo. Some of the  medicines used to treat vertigo episodes can actually make them worse in some people. SEEK IMMEDIATE MEDICAL CARE IF:   Your medicines do not relieve your vertigo or are making it worse.  You develop problems with talking, walking, weakness, or using your arms, hands, or legs.  You develop severe headaches.  Your nausea or vomiting continues or gets worse.  You develop visual changes.  A family member notices behavioral changes.  Your condition gets worse. MAKE SURE YOU:  Understand these instructions.  Will watch your condition.  Will get help right away if you are not doing well or get worse. Document Released: 06/03/2005 Document Revised: 11/16/2011 Document Reviewed: 03/12/2011 [facility] Patient Information 2015 [place], [place]. This information is not intended to replace advice given to you by your health care provider. Make sure you discuss any questions you have with your health care provider.    Smoking Cessation Quitting smoking is important to your health and has many advantages. However, it is not always easy to quit since nicotine is a very addictive drug. Oftentimes, people try 3 times or more before being able to quit. This document explains the best ways for you to prepare to quit smoking. Quitting takes hard work and a lot of effort, but you can do  it. ADVANTAGES OF QUITTING SMOKING  You will live longer, feel better, and live better.  Your body will feel the impact of quitting smoking almost immediately.  Within 20 minutes, blood pressure decreases. Your pulse returns to its normal level.  After 8 hours, carbon monoxide levels in the blood return to normal. Your oxygen level increases.  After 24 hours, the chance of having a heart attack starts to decrease. Your breath, hair, and body stop smelling like smoke.  After 48 hours, damaged nerve endings begin to recover. Your sense of taste and smell improve.  After 72 hours, the body is virtually free of nicotine. Your bronchial tubes relax and breathing becomes easier.  After 2 to 12 weeks, lungs can hold more air. Exercise becomes easier and circulation improves.  The risk of having a heart attack, stroke, cancer, or lung disease is greatly reduced.  After 1 year, the risk of coronary heart disease is cut in half.  After 5 years, the risk of stroke falls to the same as a nonsmoker.  After 10 years, the risk of lung cancer is cut in half and the risk of other cancers decreases significantly.  After 15 years, the risk of coronary heart disease drops, usually to the level of a nonsmoker.  If you are pregnant, quitting smoking will improve your chances of having a healthy baby.  The people you live with, especially any children, will be healthier.  You will have extra money to spend on things other than cigarettes. QUESTIONS TO THINK ABOUT BEFORE ATTEMPTING TO QUIT You may want to talk about your answers with your health care provider.  Why do you want to quit?  If you tried to quit in the past, what helped and what did not?  What will be the most difficult situations for you after you quit? How will you plan to handle them?  Who can help you through the tough times? Your family? Friends? A health care provider?  What pleasures do you get from smoking? What ways can you  still get pleasure if you quit? Here are some questions to ask your health care provider:  How can you help me to be successful at quitting?  What  medicine do you think would be best for me and how should I take it?  What should I do if I need more help?  What is smoking withdrawal like? How can I get information on withdrawal? GET READY  Set a quit date.  Change your environment by getting rid of all cigarettes, ashtrays, matches, and lighters in your home, car, or work. Do not let people smoke in your home.  Review your past attempts to quit. Think about what worked and what did not. GET SUPPORT AND ENCOURAGEMENT You have a better chance of being successful if you have help. You can get support in many ways.  Tell your family, friends, and coworkers that you are going to quit and need their support. Ask them not to smoke around you.  Get individual, group, or telephone counseling and support. Programs are available at [employer] and health centers. Call your local health department for information about programs in your area.  Spiritual beliefs and practices may help some smokers quit.  Download a "quit meter" on your computer to keep track of quit statistics, such as how long you have gone without smoking, cigarettes not smoked, and money saved.  Get a self-help book about quitting smoking and staying off tobacco. LEARN NEW SKILLS AND BEHAVIORS  Distract yourself from urges to smoke. Talk to someone, go for a walk, or occupy your time with a task.  Change your normal routine. Take a different route to work. Drink tea instead of coffee. Eat breakfast in a different place.  Reduce your stress. Take a hot bath, exercise, or read a book.  Plan something enjoyable to do every day. Reward yourself for not smoking.  Explore interactive web-based programs that specialize in helping you quit. GET MEDICINE AND USE IT CORRECTLY Medicines can help you stop smoking and decrease  the urge to smoke. Combining medicine with the above behavioral methods and support can greatly increase your chances of successfully quitting smoking.  Nicotine replacement therapy helps deliver nicotine to your body without the negative effects and risks of smoking. Nicotine replacement therapy includes nicotine gum, lozenges, inhalers, nasal sprays, and skin patches. Some may be available over-the-counter and others require a prescription.  Antidepressant medicine helps people abstain from smoking, but how this works is unknown. This medicine is available by prescription.  Nicotinic receptor partial agonist medicine simulates the effect of nicotine in your brain. This medicine is available by prescription. Ask your health care provider for advice about which medicines to use and how to use them based on your health history. Your health care provider will tell you what side effects to look out for if you choose to be on a medicine or therapy. Carefully read the information on the package. Do not use any other product containing nicotine while using a nicotine replacement product.  RELAPSE OR DIFFICULT SITUATIONS Most relapses occur within the first 3 months after quitting. Do not be discouraged if you start smoking again. Remember, most people try several times before finally quitting. You may have symptoms of withdrawal because your body is used to nicotine. You may crave cigarettes, be irritable, feel very hungry, cough often, get headaches, or have difficulty concentrating. The withdrawal symptoms are only temporary. They are strongest when you first quit, but they will go away within 10-14 days. To reduce the chances of relapse, try to:  Avoid drinking alcohol. Drinking lowers your chances of successfully quitting.  Reduce the amount of caffeine you consume. Once you quit  smoking, the amount of caffeine in your body increases and can give you symptoms, such as a rapid heartbeat, sweating, and  anxiety.  Avoid smokers because they can make you want to smoke.  Do not let weight gain distract you. Many smokers will gain weight when they quit, usually less than 10 pounds. Eat a healthy diet and stay active. You can always lose the weight gained after you quit.  Find ways to improve your mood other than smoking. FOR MORE INFORMATION  www.smokefree.gov  Document Released: 08/18/2001 Document Revised: 01/08/2014 Document Reviewed: 12/03/2011 [facility] Patient Information 2015 [place], [place]. This information is not intended to replace advice given to you by your health care provider. Make sure you discuss any questions you have with your health care provider.   Oxygen Use at Home Oxygen can be prescribed for home use. The prescription will show the flow rate. This is how much oxygen is to be used per minute. This will be listed in liters per minute (LPM or L/M). A liter is a metric measurement of volume. You will use oxygen therapy as directed. It can be used while exercising, sleeping, or at rest. You may need oxygen continuously. Your health care provider may order a blood oxygen test (arterial blood gas or pulse oximetry test) that will show what your oxygen level is. Your health care provider will use these measurements to learn about your needs and follow your progress. Home oxygen therapy is commonly used on patients with various lung (pulmonary) related conditions. Some of these conditions include:  Asthma.  Lung cancer.  Pneumonia.  Emphysema.  Chronic bronchitis.  Cystic fibrosis.  Other lung diseases.  Pulmonary fibrosis.  Occupational lung disease.  Heart failure.  Chronic obstructive pulmonary disease (COPD). 3 COMMON WAYS OF PROVIDING OXYGEN THERAPY  Gas: The gas form of oxygen is put into variously sized cylinders or tanks. The cylinders or oxygen tanks contain compressed oxygen. The cylinder is equipped with a regulator that controls the flow rate. Because  the flow of oxygen out of the cylinder is constant, an oxygen conserving device may be attached to the system to avoid waste. This device releases the gas only when you inhale and cuts it off when you exhale. Oxygen can be provided in a small cylinder that can be carried with you. Large tanks are heavy and are only for stationary use. After use, empty tanks must be exchanged for full tanks.  Liquid: The liquid form of oxygen is put into a container similar to a thermos. When released, the liquid converts to a gas and you breathe it in just like the compressed gas. This storage method takes up less space than the compressed gas cylinder, and you can transfer the liquid to a small, portable vessel at home. Liquid oxygen is more expensive than the compressed gas, and the vessel vents when not in use. An oxygen conserving device may be built into the vessel to conserve the oxygen. Liquid oxygen is very cold, around 297 below zero.  Oxygen concentrator: This medical device filters oxygen from room air and gives almost 100% oxygen to the patient. Oxygen concentrators are powered by electricity. Benefits of this system are:  It does not need to be resupplied.  It is not as costly as liquid oxygen.  Extra tubing permits the user to move around easier. There are several types of small, portable oxygen systems available which can help you remain active and mobile. You must have a cylinder of oxygen as a  backup in the event of a power failure. Advise your electric power company that you are on oxygen therapy in order to get priority service when there is a power failure. OXYGEN DELIVERY DEVICES There are 3 common ways to deliver oxygen to your body.  Nasal cannula. This is a 2-pronged device inserted in the nostrils that is connected to tubing carrying the oxygen. The tubing can rest on the ears or be attached to the frame of eyeglasses.  Mask. People who need a high flow of oxygen generally use a  mask.  Transtracheal catheter. Transtracheal oxygen therapy requires the insertion of a small, flexible tube (catheter) in the windpipe (trachea). This catheter is held in place by a necklace. Since transtracheal oxygen bypasses the mouth, nose, and throat, a humidifier is absolutely required at flow rates of 1 LPM or greater. OXYGEN USE SAFETY TIPS  Never smoke while using oxygen. Oxygen does not burn or explode, but flammable materials will burn faster in the presence of oxygen.  Keep a Government social research officer close by. Let your fire department know that you have oxygen in your home.  Warn visitors not to smoke near you when you are using oxygen. Put up "no smoking" signs in your home where you most often use the oxygen.  When you go to a restaurant with your portable oxygen source, ask to be seated in the nonsmoking section.  Stay at least 5 feet away from gas stoves, candles, lighted fireplaces, or other heat sources.  Do not use materials that burn easily (flammable) while using your oxygen.  If you use an oxygen cylinder, make sure it is secured to some fixed object or in a stand. If you use liquid oxygen, make sure the vessel is kept upright to keep the oxygen from pouring out. Liquid oxygen is so cold it can hurt your skin.  If you use an oxygen concentrator, call your electric company so you will be given priority service if your power goes out. Avoid using extension cords, if possible.  Regularly test your smoke detectors at home to make sure they work. If you receive care in your home from a nurse or other health care provider, he or she may also check to make sure your smoke detectors work. GUIDELINES FOR CLEANING YOUR EQUIPMENT  Wash the nasal prongs with a liquid soap. Thoroughly rinse them once or twice a week.  Replace the prongs every 2 to 4 weeks. If you have an infection (cold, pneumonia) change them when you are well.  Your health care provider will give you instructions on  how to clean your transtracheal catheter.  The humidifier bottle should be washed with soap and warm water and rinsed thoroughly between each refill. Air-dry the bottle before filling it with sterile or distilled water. The bottle and its top should be disinfected after they are cleaned.  If you use an oxygen concentrator, unplug the unit. Then wipe down the cabinet with a damp cloth and dry it daily. The air filter should be cleaned at least twice a week.  Follow your home medical equipment and service company's directions for cleaning the compressor filter. HOME CARE INSTRUCTIONS   Do not change the flow of oxygen unless directed by your health care provider.  Do not use alcohol or other sedating drugs unless instructed. They slow your breathing rate.  Do not use materials that burn easily (flammable) while using your oxygen.  Always keep a spare tank of oxygen. Plan ahead for holidays when  you may not be able to get a prescription filled.  Use water-based lubricants on your lips or nostrils. Do not use an oil-based product like petroleum jelly.  To prevent your cheeks or the skin behind your ears from becoming irritated, tuck some gauze under the tubing.  If you have persistent redness under your nose, call your health care provider.  When you no longer need oxygen, your doctor will have the oxygen discontinued. Oxygen is not addicting or habit forming.  Use the oxygen as instructed. Too much oxygen can be harmful and too little will not give you the benefit you need.  Shortness of breath is not always from a lack of oxygen. If your oxygen level is not the cause of your shortness of breath, taking oxygen will not help. SEEK MEDICAL CARE IF:   You have frequent headaches.  You have shortness of breath or a lasting cough.  You have anxiety.  You are confused.  You are drowsy or sleepy all the time.  You develop an illness which aggravates your breathing.  You cannot  exercise.  You are restless.  You have blue lips or fingernails.  You have difficult or irregular breathing and it is getting worse.  You have a fever. Document Released: 11/14/2003 Document Revised: 01/08/2014 Document Reviewed: 04/05/2013 [facility] Patient Information 2015 [place], [place]. This information is not intended to replace advice given to you by your health care provider. Make sure you discuss any questions you have with your health care provider.

## 2014-09-09 NOTE — ED Notes (Signed)
CT called. Pt has finished drinking contrast.

## 2014-09-09 NOTE — ED Notes (Addendum)
Pt reports abdominal pain, vomiting, diarrhea x1 week. Pt denies any recent abx use.

## 2014-09-09 NOTE — ED Notes (Signed)
O2 increased to 3L. Pt unable to maintain sat above 90 on 2L.

## 2014-09-09 NOTE — ED Notes (Signed)
O2 change reported to Dr. Elesa Massed.

## 2014-09-09 NOTE — ED Notes (Signed)
Pt in Radiology at present. 

## 2014-09-09 NOTE — ED Provider Notes (Addendum)
This chart was scribed for Melanie Maw Ward, DO by Tonye Royalty, ED Scribe. This patient was seen in room APA14/APA14   TIME SEEN: 1316  CHIEF COMPLAINT: nausea  HPI: Melanie Fields is a 81 y.o. female with history of hypertension, diabetes, cirrhosis thought secondary to medications who presents to the Emergency Department complaining of intermittent nausea with onset 1 month ago, worse since 1 week ago. She states she spits up brown material, but states it is not really vomiting. She also reports pain to her left abdomen that is intermittent and headache that is also intermittent. She reports recent decreased appetite and weight loss. She also reports associated room-spinning dizziness when laying down that has been present for several years, dry cough, and SOB with exertion that she has been experiencing for several months. She is a smoker. Reports a history of COPD but is not red oxygen at home. No history of CAD but they are aware of but states patient is on Plavix. No chest pain. Per granddaughter, she has had difficulty tolerating food. 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 She denies wearing oxygen at home. She denies diarrhea, fever, or abdominal distention.  History limited as patient and family are very poor historians.  ROS: See HPI Constitutional: no fever, positive decreased appetite, weight loss Eyes: no drainage  ENT: no runny nose   Cardiovascular:  no chest pain Resp: no SOB, positive chronic SOB and cough GI: negative abdominal distention, positive nausea, vomiting, abdominal pain GU: no dysuria Integumentary: no rash  Allergy: no hives  Musculoskeletal: no leg swelling  Neurological: no slurred speech, positive headache, chronic dizziness ROS otherwise negative   PAST MEDICAL HISTORY/PAST SURGICAL HISTORY:  Past Medical History  Diagnosis Date  . Diabetes mellitus   . Hypertension   . Cirrhosis   . Back pain     MEDICATIONS:  Prior to  Admission medications   Medication Sig Start Date End Date Taking? Authorizing Provider  albuterol (PROVENTIL HFA) 108 (90 BASE) MCG/ACT inhaler Inhale 2 puffs into the lungs 2 (two) times daily.      Historical Provider, MD  clopidogrel (PLAVIX) 75 MG tablet Take 75 mg by mouth daily.      Historical Provider, MD  glipiZIDE (GLUCOTROL XL) 10 MG 24 hr tablet Take 10 mg by mouth daily.      Historical Provider, MD  HYDROcodone-acetaminophen (NORCO) 10-325 MG per tablet Take 1 tablet by mouth every 4 (four) hours as needed. Pain     Historical Provider, MD  valsartan (DIOVAN) 80 MG tablet Take 80 mg by mouth daily.      Historical Provider, MD    ALLERGIES:  Allergies  Allergen Reactions  . Sulfonamide Derivatives Itching  . Penicillins Rash    SOCIAL HISTORY:  History  Substance Use Topics  . Smoking status: Current Every Day Smoker -- 0.50 packs/day  . Smokeless tobacco: Not on file  . Alcohol Use: No    FAMILY HISTORY: History reviewed. No pertinent family history.  EXAM: BP 159/92 mmHg  Pulse 110  Temp(Src) 97.7 F (36.5 C) (Oral)  Resp 24  Ht 5' 5.5" (1.664 m)  Wt 145 lb (65.772 kg)  BMI 23.75 kg/m2  SpO2 100% CONSTITUTIONAL: Alert and oriented and responds appropriately to questions. Elderly, chronically ill appearing but nontoxic and in no distress HEAD: Normocephalic EYES: Conjunctivae clear, PERRL ENT: normal nose; no rhinorrhea; slightly dry mucous membranes; pharynx without lesions noted NECK: Supple, no meningismus, no  LAD  CARD: Regular and tachycardic; S1 and S2 appreciated; no murmurs, no clicks, no rubs, no gallops RESP: Normal chest excursion without splinting, no tachypnea, speaking full sentences, patient does have diminished breath sounds at her bases and some out of her wheezing, no rhonchi or rales, no respiratory distress ABD/GI: Normal bowel sounds; non-distended; soft, no rebound; Abdomen diffusely tender to palpation with voluntary guarding; no  rebound or peritoneal signs; no tympany or fluid wave BACK:  The back appears normal and is non-tender to palpation, there is no CVA tenderness EXT: Normal ROM in all joints; non-tender to palpation; no edema; normal capillary refill; no cyanosis    SKIN: Normal color for age and race; warm NEURO: Moves all extremities equally; sensation to light touch intact diffusely, cranial nerves II through XII intact, no dysmetria to finger-nose testing, no nystagmus PSYCH: The patient's mood and manner are appropriate. Grooming and personal hygiene are appropriate.  MEDICAL DECISION MAKING: Patient here with multiple complaints. She is complaining of vertigo that is worse with lying flat is been present for over a year. She is also had dyspnea on exertion and dry cough that is unchanged and present for several months. No chest pain. She is also complaining of 1-2 weeks of intermittent nausea and diarrhea and left-sided abdominal pain. Family reports she has had weight loss and decreased appetite. Differential diagnosis is large but most of these conditions seem chronic in nature. She is hemodynamically stable. Will obtain labs, chest x-ray, CT of her abdomen and pelvis, urinalysis.  ED PROGRESS: Labs show some hemoconcentration - receiving IV fluids. LFTs, lipase normal. Troponin negative. EKG shows bifascicular block with no old for comparison.   Patient continues to have hypoxia despite being on oxygen. Does not wear oxygen at home. She has some better aeration after DuoNeb treatment and reports feeling better. We'll give second DuoNeb, Solu-Medrol.  Anticipate admission for new onset hypoxia. No history of PE or DVT.  We'll obtain a CTA of her chest for further evaluation.  5:10 PM  Pt's ABG shows mild hypoxia with PO2 of 63.4 on 3 L nasal cannula, PCO2 is 34.6, pH 7.42.  CT of her chest shows no pulmonary embolus, pneumonia. There are numerous pulmonary nodules and COPD changes. CT of her abdomen shows  cirrhosis and a right ovarian cystic lesion that is stable. She is still hypoxic when taken off oxygen and does not wear oxygen at home. Her lungs are now clear to auscultation and she reports her shortness of breath has improved. She states that she sees her primary care provider, Dr. Megan Mans, every month and has never been told she is been hypoxic in the past. We'll discuss with hospitalist for admission for new onset hypoxia likely in the setting of continued tobacco use, COPD.  5:40 PM  D/w Dr. Karilyn Cota.  We both agree that most of these conditions are likely chronic in nature and that she is not having a true COPD exacerbation that needs admission to the hospital. Will have AC see if we can set up home O2 from ED to dc pt home tonight. Discussed with patient's nephew who agrees. Have discussed with patient at length that she will need to follow with her primary care physician for multiple chronic conditions. She is tolerating by mouth in the ED and has not had any further vomiting or diarrhea. Reports that her headache, abdominal pain are gone. Discussed with patient in the future and that patient will need is not smoking as it  is very dangerous to continue to smoke while wearing oxygen. She verbalized understanding. Have recommended she continue talking to her primary care doctor about tobacco cessation.   7:30 PM  [facility]. They will be here in the next hour. They state that they are unable to accept oxygen saturations from the emergency department but she will have 30 days to go to her PCP and have the proper forms filled out and faxed back to [facility] who will bill her insurance. Have discussed this with patient and family. Discussed with them that they will need to keep her oxygen between 3-4 L to keep her sats above 88%. Have advised close PCP follow-up. They verbalize understanding and are comfortable with plan.  8:15 PM  Pt does have  fluctuation of her oxygen saturation on the monitor but does not have a good waveform and is not persistent. Her sats are greater than 88% on 3 L.    EKG Interpretation  Date/Time:  Sunday September 09 2014 13:10:57 EST Ventricular Rate:  80 PR Interval:  144 QRS Duration: 148 QT Interval:  402 QTC Calculation: 464 R Axis:   122 Text Interpretation:  Sinus rhythm RBBB and LPFB No old tracing to compare Confirmed by WARD,  DO, KRISTEN ([id number]) on 09/09/2014 1:32:56 PM        I personally performed the services described in this documentation, which was scribed in my presence. The recorded information has been reviewed and is accurate.   Melanie Maw Ward, DO 09/09/14 1924  Melanie Maw Ward, DO 09/09/14 2018

## 2014-09-09 NOTE — ED Notes (Signed)
Pt O2 sat 90% on 2L.

## 2014-09-09 NOTE — ED Notes (Signed)
BP recycled.139/72. Incorrect reading on prior data validation.

## 2014-09-11 LAB — URINE CULTURE

## 2015-10-03 ENCOUNTER — Emergency Department (HOSPITAL_COMMUNITY): Payer: Medicare Other

## 2015-10-03 ENCOUNTER — Encounter (HOSPITAL_COMMUNITY): Payer: Self-pay | Admitting: Emergency Medicine

## 2015-10-03 ENCOUNTER — Emergency Department (HOSPITAL_COMMUNITY)
Admission: EM | Admit: 2015-10-03 | Discharge: 2015-10-04 | Disposition: A | Discharge status: Home / Self Care | Payer: Medicare Other | Attending: Emergency Medicine | Admitting: Emergency Medicine

## 2015-10-03 DIAGNOSIS — Z8719 Personal history of other diseases of the digestive system: Secondary | ICD-10-CM | POA: Diagnosis not present

## 2015-10-03 DIAGNOSIS — Z7901 Long term (current) use of anticoagulants: Secondary | ICD-10-CM | POA: Diagnosis not present

## 2015-10-03 DIAGNOSIS — R4182 Altered mental status, unspecified: Secondary | ICD-10-CM | POA: Diagnosis not present

## 2015-10-03 DIAGNOSIS — N390 Urinary tract infection, site not specified: Secondary | ICD-10-CM

## 2015-10-03 DIAGNOSIS — I10 Essential (primary) hypertension: Secondary | ICD-10-CM | POA: Diagnosis not present

## 2015-10-03 DIAGNOSIS — E11649 Type 2 diabetes mellitus with hypoglycemia without coma: Secondary | ICD-10-CM | POA: Diagnosis present

## 2015-10-03 DIAGNOSIS — Z7952 Long term (current) use of systemic steroids: Secondary | ICD-10-CM | POA: Insufficient documentation

## 2015-10-03 DIAGNOSIS — Z880 Allergy status to penicillin: Secondary | ICD-10-CM | POA: Diagnosis not present

## 2015-10-03 DIAGNOSIS — F17200 Nicotine dependence, unspecified, uncomplicated: Secondary | ICD-10-CM | POA: Insufficient documentation

## 2015-10-03 DIAGNOSIS — E162 Hypoglycemia, unspecified: Secondary | ICD-10-CM

## 2015-10-03 DIAGNOSIS — Z79899 Other long term (current) drug therapy: Secondary | ICD-10-CM | POA: Diagnosis not present

## 2015-10-03 LAB — COMPREHENSIVE METABOLIC PANEL
ALK PHOS: 117 U/L (ref 38–126)
ALT: 9 U/L — ABNORMAL LOW (ref 14–54)
AST: 21 U/L (ref 15–41)
Albumin: 4.2 g/dL (ref 3.5–5.0)
Anion gap: 9 (ref 5–15)
BUN: 12 mg/dL (ref 6–20)
CALCIUM: 10 mg/dL (ref 8.9–10.3)
CO2: 24 mmol/L (ref 22–32)
Chloride: 105 mmol/L (ref 101–111)
Creatinine, Ser: 0.89 mg/dL (ref 0.44–1.00)
Glucose, Bld: 63 mg/dL — ABNORMAL LOW (ref 65–99)
Potassium: 4 mmol/L (ref 3.5–5.1)
Sodium: 138 mmol/L (ref 135–145)
Total Bilirubin: 0.8 mg/dL (ref 0.3–1.2)
Total Protein: 7.5 g/dL (ref 6.5–8.1)

## 2015-10-03 LAB — CBC
HEMATOCRIT: 46.2 % — AB (ref 36.0–46.0)
HEMOGLOBIN: 15.9 g/dL — AB (ref 12.0–15.0)
MCH: 32.8 pg (ref 26.0–34.0)
MCHC: 34.4 g/dL (ref 30.0–36.0)
MCV: 95.3 fL (ref 78.0–100.0)
Platelets: 172 10*3/uL (ref 150–400)
RBC: 4.85 MIL/uL (ref 3.87–5.11)
RDW: 13.4 % (ref 11.5–15.5)
WBC: 12.8 10*3/uL — ABNORMAL HIGH (ref 4.0–10.5)

## 2015-10-03 LAB — CBG MONITORING, ED
Glucose-Capillary: 91 mg/dL (ref 65–99)
Glucose-Capillary: 59 mg/dL — ABNORMAL LOW (ref 65–99)
Glucose-Capillary: 75 mg/dL (ref 65–99)

## 2015-10-03 LAB — PROTIME-INR
INR: 1.09 (ref 0.00–1.49)
PROTHROMBIN TIME: 14.3 s (ref 11.6–15.2)

## 2015-10-03 LAB — AMMONIA: Ammonia: 14 umol/L (ref 9–35)

## 2015-10-03 MED ORDER — ACETAMINOPHEN 325 MG PO TABS
325.0000 mg | ORAL_TABLET | Freq: Once | ORAL | Status: AC
  Administered 2015-10-03: 325 mg via ORAL
  Filled 2015-10-03: qty 1

## 2015-10-03 NOTE — ED Provider Notes (Signed)
CSN: [id number]     Arrival date & time 10/03/15  2050 History  By signing my name below, I, [facility], attest that this documentation has been prepared under the direction and in the presence of Linwood Dibbles, MD. Electronically Signed: Randell Patient, ED Scribe. 10/03/2015. 11:35 PM.   Chief Complaint  Patient presents with  . Hypoglycemia  . Altered Mental Status   The history is provided by the patient and a relative. No language interpreter was used.   HPI Comments: Melanie Fields is a 82 y.o. female brought in by EMS on home O2 with an hx of DM, HTN, cirrhosis, and lung caner with possible metastases to the left eye who presents to the Emergency Department complaining of hypoglycemia and altered mental status earlier today. Granddaughter reports that the patient's nephew was called earlier today by the patient's daughter when the patient had altered LOC. She reports that the nephew notified EMS who checked the patient's blood glucose was low (50 upon). Patient endorses associated dizziness and polydipsia. She has taken motrin and her oral DM medications. Relative states that the patient is unable to recall the last time the patient ate. She has an hx of cirrhosis and lung cancer that was diagnosed 1 year ago but relative states that the patient is not being treated with chemotherapy at this time. Per relative, patient has an appointment in the near future with Dr. Butch Penny about her lung cancer. Patient is current smoker. Denies any other symptoms currently.  Past Medical History  Diagnosis Date  . Diabetes mellitus   . Hypertension   . Cirrhosis (HCC)   . Back pain    History reviewed. No pertinent past surgical history. History reviewed. No pertinent family history. Social History  Substance Use Topics  . Smoking status: Current Every Day Smoker -- 0.50 packs/day  . Smokeless tobacco: None  . Alcohol Use: No   OB History    No data available     Review of Systems   Endocrine: Positive for polydipsia.  Neurological: Positive for dizziness.      Allergies  Sulfonamide derivatives and Penicillins  Home Medications   Prior to Admission medications   Medication Sig Start Date End Date Taking? Authorizing Provider  albuterol (PROVENTIL HFA) 108 (90 BASE) MCG/ACT inhaler Inhale 1 puff into the lungs every 4 (four) hours as needed for wheezing or shortness of breath.     Historical Provider, MD  albuterol (PROVENTIL HFA;VENTOLIN HFA) 108 (90 BASE) MCG/ACT inhaler Inhale 2 puffs into the lungs every 4 (four) hours as needed for wheezing or shortness of breath. 09/09/14   Kristen N Ward, DO  clopidogrel (PLAVIX) 75 MG tablet Take 75 mg by mouth daily.      Historical Provider, MD  ibuprofen (ADVIL,MOTRIN) 200 MG tablet Take 400 mg by mouth every 6 (six) hours as needed for mild pain.    Historical Provider, MD  loperamide (IMODIUM) 2 MG capsule Take 1 capsule (2 mg total) by mouth 4 (four) times daily as needed for diarrhea or loose stools. 09/09/14   Kristen N Ward, DO  meclizine (ANTIVERT) 25 MG tablet Take 25 mg by mouth 3 (three) times daily as needed for dizziness or nausea.    Historical Provider, MD  ondansetron (ZOFRAN ODT) 4 MG disintegrating tablet Take 1 tablet (4 mg total) by mouth every 8 (eight) hours as needed for nausea or vomiting. 09/09/14   Kristen N Ward, DO  predniSONE (DELTASONE) 20 MG tablet Take 3  tablets (60 mg total) by mouth daily. 09/09/14   Kristen N Ward, DO  traMADol (ULTRAM) 50 MG tablet Take 50 mg by mouth every 6 (six) hours as needed for moderate pain.    Historical Provider, MD   BP 168/60 mmHg  Pulse 79  Temp(Src) 97.8 F (36.6 C) (Oral)  Resp 18  SpO2 99% Physical Exam  Constitutional: No distress.  Elderly and frail.  HENT:  Head: Normocephalic and atraumatic.  Right Ear: External ear normal.  Left Ear: External ear normal.  Hard of hearing.  Eyes: Conjunctivae are normal. Right eye exhibits no discharge. Left eye  exhibits no discharge. No scleral icterus.  Neck: Neck supple. No tracheal deviation present.  Cardiovascular: Normal rate, regular rhythm and intact distal pulses.   Pulmonary/Chest: Effort normal and breath sounds normal. No stridor. No respiratory distress. She has no wheezes. She has no rales.  Abdominal: Soft. Bowel sounds are normal. She exhibits no distension. There is no tenderness. There is no rebound and no guarding.  Musculoskeletal: She exhibits no edema or tenderness.  Neurological: She is alert. She has normal strength. No cranial nerve deficit (no facial droop, extraocular movements intact, no slurred speech) or sensory deficit. She exhibits normal muscle tone. She displays no seizure activity. Coordination normal.  Skin: Skin is warm and dry. No rash noted.  Psychiatric: She has a normal mood and affect.  Nursing note and vitals reviewed.   ED Course  Procedures   DIAGNOSTIC STUDIES: Oxygen Saturation is 96% on RA, adequate by my interpretation.    COORDINATION OF CARE: 9:42 PM Will order labs, chest x-ray, head CT. Will provide pt with food and drink. Discussed treatment plan with pt at bedside and pt agreed to plan.  Labs Review Labs Reviewed  CBC - Abnormal; Notable for the following:    WBC 12.8 (*)    Hemoglobin 15.9 (*)    HCT 46.2 (*)    All other components within normal limits  COMPREHENSIVE METABOLIC PANEL - Abnormal; Notable for the following:    Glucose, Bld 63 (*)    ALT 9 (*)    All other components within normal limits  CBG MONITORING, ED - Abnormal; Notable for the following:    Glucose-Capillary 59 (*)    All other components within normal limits  PROTIME-INR  AMMONIA  CBG MONITORING, ED  CBG MONITORING, ED  CBG MONITORING, ED    Imaging Review Dg Chest 2 View  10/03/2015  CLINICAL DATA:  Confusion altered level of consciousness. EXAM: CHEST  2 VIEW COMPARISON:  Chest CT scan 09/09/2014. PA and lateral chest 04/04/2011. FINDINGS: The lungs  are markedly emphysematous with pulmonary hyperexpansion and distortion of the pulmonary architecture seen. No focal airspace disease, pneumothorax or effusion. Heart size is normal. Thoracic spondylosis noted. IMPRESSION: Advanced appearing emphysema without acute disease. Electronically Signed   By: Drusilla Kanner M.D.   On: 10/03/2015 22:49   Ct Head Wo Contrast  10/03/2015  CLINICAL DATA:  Altered mental status tonight.  Initial encounter. EXAM: CT HEAD WITHOUT CONTRAST TECHNIQUE: Contiguous axial images were obtained from the base of the skull through the vertex without intravenous contrast. COMPARISON:  None. FINDINGS: The brain is atrophic with chronic microvascular ischemic change. No evidence of acute intracranial abnormality including hemorrhage, infarct, mass lesion, mass effect, midline shift or abnormal extra-axial fluid collection is identified. No hydrocephalus or pneumocephalus. The calvarium is intact. IMPRESSION: No acute abnormality. Atrophy and chronic microvascular ischemic change. Electronically Signed   By:  Drusilla Kanner M.D.   On: 10/03/2015 22:46   I have personally reviewed and evaluated these images and lab results as part of my medical decision-making.   MDM   Final diagnoses:  Hypoglycemia    Patient presented to the emergency room with an episode of hypoglycemia. She has remained otherwise stable here in the emergency room. She was given something to eat. I will plan to continue to monitor blood sugar.  There is some confusion in that that her current medication list doesn't list any oral hypoglycemic agents..  Family states she does take medications for diabetes. There has been some uncertainty recently as her primary doctor retired and she has not seen her new doctor.  Reviewed her old records.  Patient actually does not have a history of lung cancer. I think there may been some confusion in that a CAT scan that she had about a year ago recommended a follow-up  study because of her smoking history.  Family was concerned about possible metastases to the brain and therefore CT scan was done before I was able to  check her records and see that she does not actually have a confirmed history of lung cancer.  Blood sugar was  Monitored.  It has remained stable.  I personally performed the services described in this documentation, which was scribed in my presence.  The recorded information has been reviewed and is accurate.   Linwood Dibbles, MD 10/03/15 [id number]

## 2015-10-03 NOTE — ED Notes (Signed)
EMS called to house by family for altered LOC. Blood Glucose was 50 when checked. Pt not able to take po so EMS gave 12.5 of D50. Pt became more responsive. Recheck of Blood glucose en route was 175. Pt very HOH but alert upon arrival to hospital.

## 2015-10-03 NOTE — ED Notes (Signed)
Patient given graham crackers and peanut butter along with a coke per nurses orders due to low blood sugar.

## 2015-10-04 LAB — URINALYSIS, ROUTINE W REFLEX MICROSCOPIC
BILIRUBIN URINE: NEGATIVE
Glucose, UA: NEGATIVE mg/dL
KETONES UR: NEGATIVE mg/dL
LEUKOCYTES UA: NEGATIVE
NITRITE: POSITIVE — AB
PH: 6.5 (ref 5.0–8.0)
Protein, ur: 30 mg/dL — AB
SPECIFIC GRAVITY, URINE: 1.015 (ref 1.005–1.030)

## 2015-10-04 LAB — URINE MICROSCOPIC-ADD ON

## 2015-10-04 LAB — CBG MONITORING, ED: Glucose-Capillary: 109 mg/dL — ABNORMAL HIGH (ref 65–99)

## 2015-10-04 MED ORDER — CIPROFLOXACIN HCL 500 MG PO TABS: 500.0000 mg | ORAL_TABLET | Freq: Two times a day (BID) | ORAL | Status: DC

## 2015-10-04 MED ORDER — CIPROFLOXACIN HCL 250 MG PO TABS
500.0000 mg | ORAL_TABLET | Freq: Once | ORAL | Status: AC
  Administered 2015-10-04: 500 mg via ORAL
  Filled 2015-10-04: qty 2

## 2015-10-04 NOTE — ED Notes (Signed)
Family member came to nursing station and stated patient is complaining of burning with urination starting today. When asked, patient states "it burns real bad when I pee a drop or two." Advised Dr Lynelle Doctor. Verbal order for UA to be collected.

## 2015-10-04 NOTE — Discharge Instructions (Signed)
Hypoglycemia Low blood sugar (hypoglycemia) means that the level of sugar in your blood is lower than it should be. Signs of low blood sugar include:  Getting sweaty.  Feeling hungry.  Feeling dizzy or weak.  Feeling sleepier than normal.  Feeling nervous.  Headaches.  Having a fast heartbeat. Low blood sugar can happen fast and can be an emergency. Your doctor can do tests to check your blood sugar level. You can have low blood sugar and not have diabetes. HOME CARE  Check your blood sugar as told by your doctor. If it is less than 70 mg/dl or as told by your doctor, take 1 of the following:  3 to 4 glucose tablets.   cup clear juice.   cup soda pop, not diet.  1 cup milk.  5 to 6 hard candies.  Recheck blood sugar after 15 minutes. Repeat until it is at the right level.  Eat a snack if it is more than 1 hour until the next meal.  Only take medicine as told by your doctor.  Do not skip meals. Eat on time.  Do not drink alcohol except with meals.  Check your blood glucose before driving.  Check your blood glucose before and after exercise.  Always carry treatment with you, such as glucose pills.  Always wear a medical alert bracelet if you have diabetes. GET HELP RIGHT AWAY IF:   Your blood glucose goes below 70 mg/dl or as told by your doctor, and you:  Are confused.  Are not able to swallow.  Pass out (faint).  You cannot treat yourself. You may need someone to help you.  You have low blood sugar problems often.  You have problems from your medicines.  You are not feeling better after 3 to 4 days.  You have vision changes. MAKE SURE YOU:   Understand these instructions.  Will watch this condition.  Will get help right away if you are not doing well or get worse.   This information is not intended to replace advice given to you by your health care provider. Make sure you discuss any questions you have with your health care provider.     Document Released: 11/18/2009 Document Revised: 09/14/2014 Document Reviewed: 04/30/2015 Elsevier Interactive Patient Education 2016 [employer]  

## 2015-10-08 LAB — URINE CULTURE: Culture: >=100000

## 2015-10-09 ENCOUNTER — Telehealth (HOSPITAL_BASED_OUTPATIENT_CLINIC_OR_DEPARTMENT_OTHER): Payer: Self-pay | Admitting: Emergency Medicine

## 2015-10-09 NOTE — Telephone Encounter (Signed)
Post ED Visit - Positive Culture Follow-up  Culture report reviewed by antimicrobial stewardship pharmacist:   Enzo Bi, Pharm.D.  Celedonio Miyamoto, Pharm.D., BCPS  Garvin Fila, Pharm.D.  Georgina Pillion, Pharm.D., BCPS  [place], [address].D., BCPS, AAHIVP  Estella Husk, Pharm.D., BCPS, AAHIVP  Tennis Must, Pharm.D.  Sherle Poe, [address].D.  Positive urine culture Klebsiella Treated with ciprofloxacin, organism sensitive to the same and no further patient follow-up is required at this time.  Berle Mull 10/09/2015, 11:01 AM

## 2015-12-20 IMAGING — CT CT ABD-PELV W/ CM
3 of 11 series · 8 of 46 positions shown, 13 images · IV contrast (Omnipaque 300)
Comparison: 07/26/09

CLINICAL DATA: Initial evaluation for dyspnea and nausea for one
month, getting worse in the past week with left side abdominal pain
and weight loss, personal history of diabetes, smoking

EXAM:
CT ANGIOGRAPHY CHEST
CT ABDOMEN AND PELVIS WITH CONTRAST
TECHNIQUE: Multidetector CT imaging of the chest was performed using the
standard protocol during bolus administration of intravenous
contrast. Multiplanar CT image reconstructions and MIPs were
obtained to evaluate the vascular anatomy. Multidetector CT imaging
of the abdomen and pelvis was performed using the standard protocol
during bolus administration of intravenous contrast.
CONTRAST:  50mL OMNIPAQUE IOHEXOL 300 MG/ML SOLN, 1 OMNIPAQUE
IOHEXOL 300 MG/ML SOLN, 100mL OMNIPAQUE IOHEXOL 350 MG/ML SOLN

[Series 4: pe 3.0 b40f · axial · 0.66mm/px · z∈[-266,-102]mm · 4 of 93 slices shown]
[im 19/93  soft-tissue]
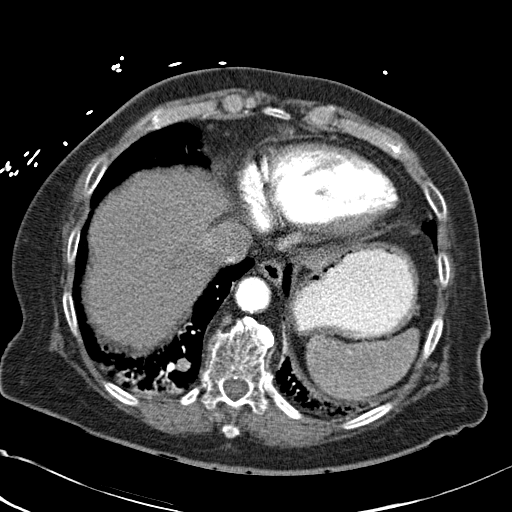
[im 37/93  soft-tissue]
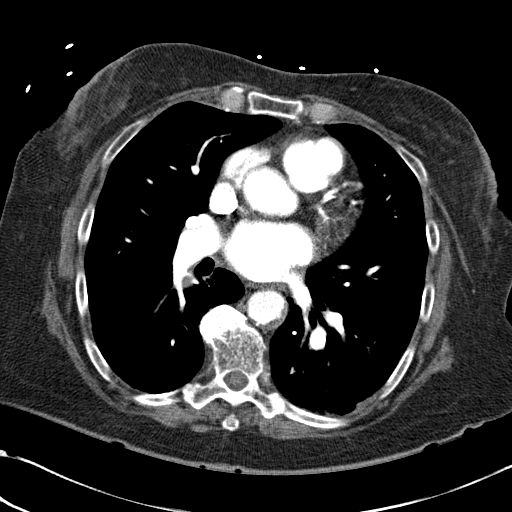
[im 56/93  soft-tissue]
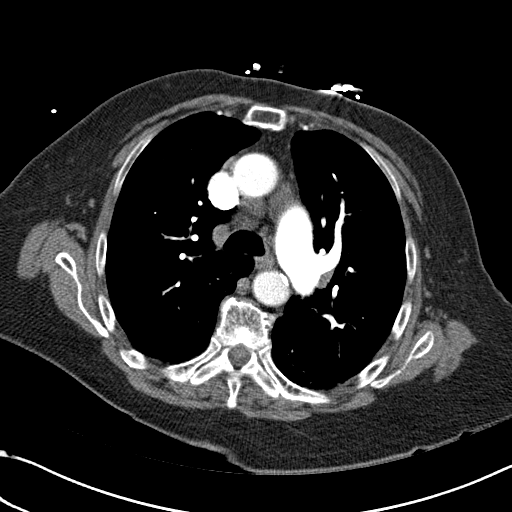
[im 74/93  soft-tissue]
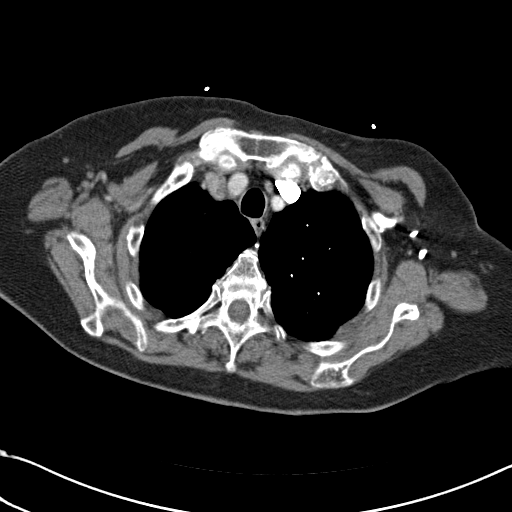

[Series 6: mpr coronal pe · coronal · 0.53mm/px · 1 of 93 slices shown, 2 images]
[im 47/93  soft-tissue]
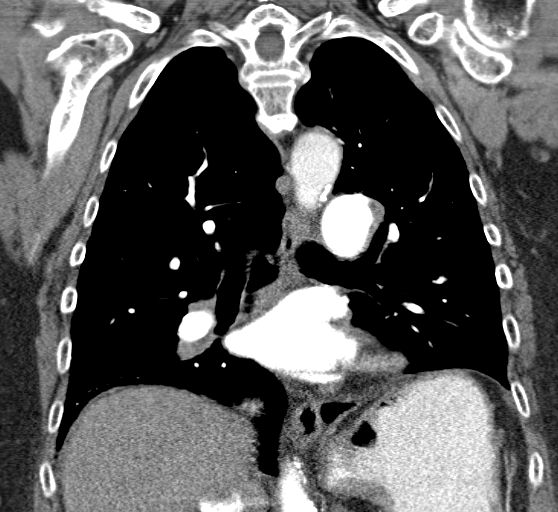
[im 47/93  bone]
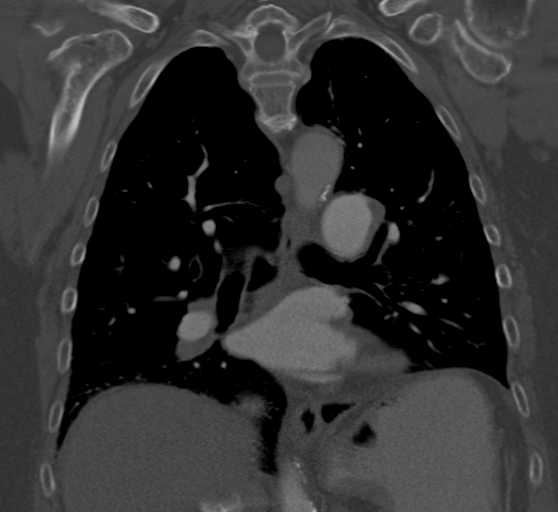

[Series 10: abd_pel 5.0 b40f · axial · 0.79mm/px · z∈[-542,-328]mm · 3 of 87 slices shown, 7 images]
[im 22/87  soft-tissue]
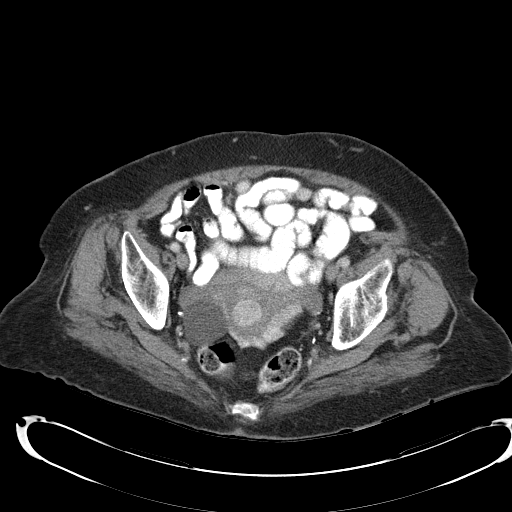
[im 22/87  lung]
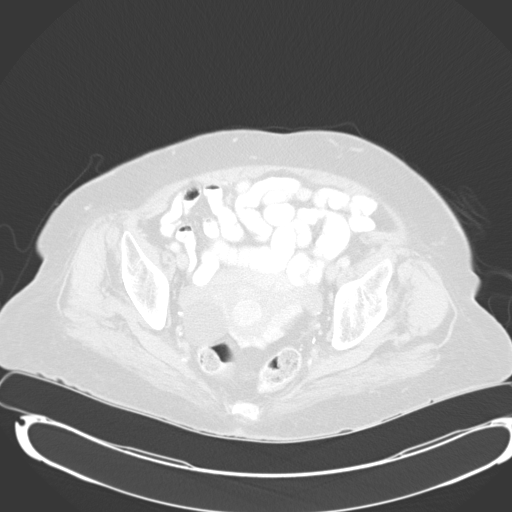
[im 22/87  bone]
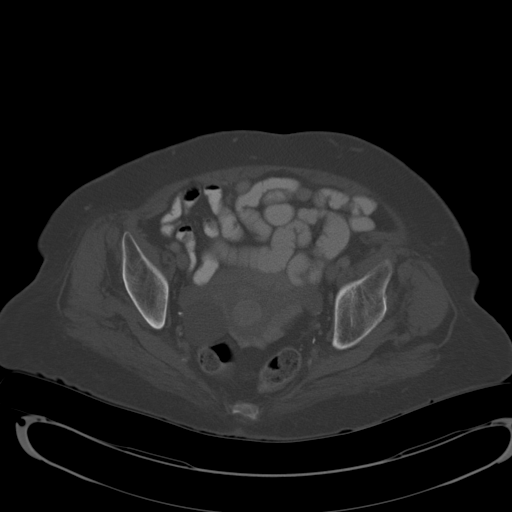
[im 44/87  soft-tissue]
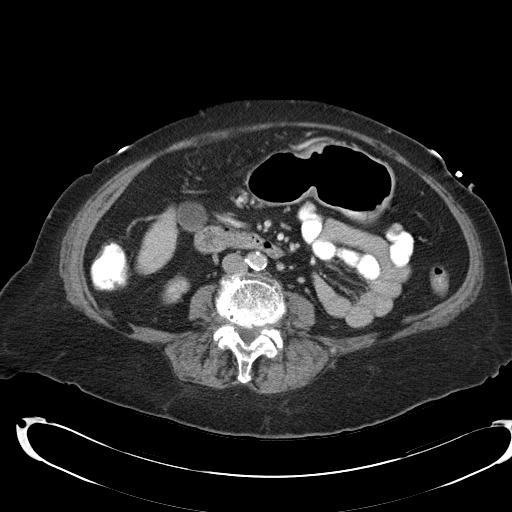
[im 44/87  lung]
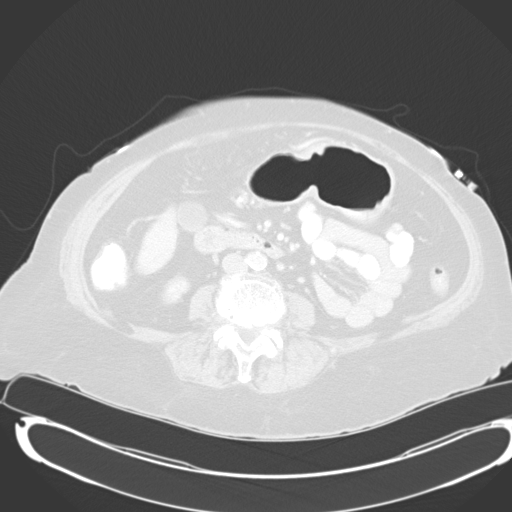
[im 65/87  soft-tissue]
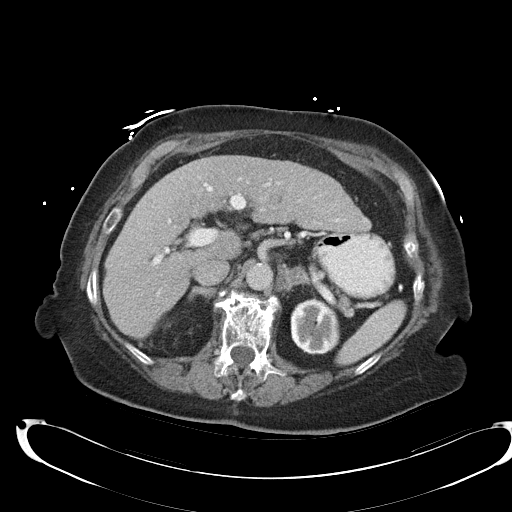
[im 65/87  lung]
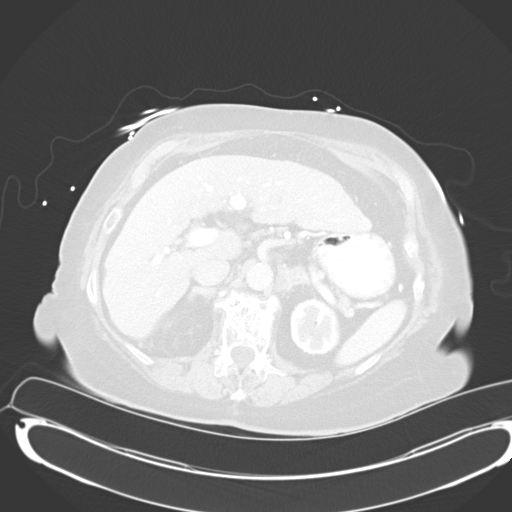

[8 of 46 positions shown; findings below may reference images not displayed]

FINDINGS: CTA CHEST FINDINGS

No filling defects in the pulmonary arterial system to suggest
pulmonary embolism. Thoracic aorta shows no dissection or
dilatation. There is atherosclerotic change with noncalcified and
calcified plaque. There is coronary arterial calcification. There is
mild cardiac enlargement.

There is moderately severe diffuse centrilobular emphysematous
change particularly in the upper lobes. There are numerous bilateral
pulmonary nodules including a 10 mm right middle lobe pulmonary
nodule and a 10 mm right lower lobe pulmonary nodule that are stable
when compared to [DATE]. There is a 7 mm nodule in the lingula
series 5, image 40. There is another 7 mm nodule in the lingula
image number 45. There is an 8 mm left lower lobe pulmonary nodule
image number 55. There is a 7 mm pulmonary nodule right upper lobe
image number 48. There is bilateral hilar and mediastinal
lymphadenopathy, with 13 mm aortopulmonary window lymph node and 16
mm right hilar lymph node. In the left hilum, the largest lymph node
measures 13 mm.

There is no pleural effusion.  There is no pericardial effusion.

CT ABDOMEN and PELVIS FINDINGS

Liver shows a somewhat nodular contour similar to prior study again
suggesting cirrhosis. There is increased number of gallstones.
Spleen is normal. Pancreas is normal. Numerous peripancreatic and
portal lymph nodes are enlarged, not significantly different from
prior study.

There are bilateral adrenal gland masses measuring 27 x 20 mm on the
right and 20 x 21 mm on the left. These are not significantly
different from 6838. There are bilateral renal cysts which are
stable. Additionally there is a 23 mm low-attenuation lesion
posterior cortex midpole right kidney with average Hounsfield
attenuation value of 24 on initial postcontrast images and 28 on
delayed postcontrast images. It is of indeterminate significance. It
is graft there is calcification of the abdominal aorta. Small bowel
and stomach are normal. Large bowel is normal. Bladder is normal.

3.4 cm cystic lesion right adnexa is stable. 25 mm hyper attenuating
or enhancing lesion in the uterus centrally, not seen on prior non
contrasted study. Left ovary appears normal. No free fluid.

No acute musculoskeletal findings.

Review of the MIP images confirms the above findings.
IMPRESSION: *No evidence of pulmonary embolism or pneumonia.
*Numerous pulmonary nodules. Two of these can be demonstrated to be
stable when compared to prior CT abdomen pelvis performed 6838.
Others are located in areas of the lung which have not been
previously imaged. The largest of these is 7 mm. If the patient is
at high risk for bronchogenic carcinoma, follow-up chest CT at
3-1months is recommended. If the patient is at low risk for
bronchogenic carcinoma, follow-up chest CT at 6-12 months is
recommended. This recommendation follows the consensus statement:
Guidelines for Management of Small Pulmonary Nodules Detected on CT
Scans: A Statement from the [HOSPITAL] as published in
*Hilar and mediastinal adenopathy, nonspecific.
*COPD
*Numerous stable abnormalities in the abdomen and pelvis including
evidence of cirrhosis, adrenal masses, and adenopathy. Also stable
right ovarian cystic lesion.
*Indeterminate 2.3 cm right renal lesion possibly a complicated
cyst. Renal ultrasound suggested.
*2.5 cm uterine lesion possibly a fibroid. Endometrial mass not
excluded. Consider pelvic ultrasound or MRI.

## 2016-01-30 ENCOUNTER — Emergency Department (HOSPITAL_COMMUNITY): Payer: Medicare Other

## 2016-01-30 ENCOUNTER — Emergency Department (HOSPITAL_COMMUNITY)
Admission: EM | Admit: 2016-01-30 | Discharge: 2016-01-30 | Disposition: A | Discharge status: Home / Self Care | Payer: Medicare Other | Attending: Emergency Medicine | Admitting: Emergency Medicine

## 2016-01-30 ENCOUNTER — Encounter (HOSPITAL_COMMUNITY): Payer: Self-pay | Admitting: Emergency Medicine

## 2016-01-30 DIAGNOSIS — I10 Essential (primary) hypertension: Secondary | ICD-10-CM | POA: Insufficient documentation

## 2016-01-30 DIAGNOSIS — M25531 Pain in right wrist: Secondary | ICD-10-CM | POA: Insufficient documentation

## 2016-01-30 DIAGNOSIS — E119 Type 2 diabetes mellitus without complications: Secondary | ICD-10-CM | POA: Diagnosis not present

## 2016-01-30 DIAGNOSIS — Z7984 Long term (current) use of oral hypoglycemic drugs: Secondary | ICD-10-CM | POA: Insufficient documentation

## 2016-01-30 DIAGNOSIS — Z79899 Other long term (current) drug therapy: Secondary | ICD-10-CM | POA: Diagnosis not present

## 2016-01-30 DIAGNOSIS — F17210 Nicotine dependence, cigarettes, uncomplicated: Secondary | ICD-10-CM | POA: Diagnosis not present

## 2016-01-30 LAB — COMPREHENSIVE METABOLIC PANEL
ALK PHOS: 128 U/L — AB (ref 38–126)
ALT: 9 U/L — AB (ref 14–54)
AST: 14 U/L — AB (ref 15–41)
Albumin: 4 g/dL (ref 3.5–5.0)
Anion gap: 8 (ref 5–15)
BILIRUBIN TOTAL: 0.6 mg/dL (ref 0.3–1.2)
BUN: 14 mg/dL (ref 6–20)
CALCIUM: 10 mg/dL (ref 8.9–10.3)
CO2: 21 mmol/L — ABNORMAL LOW (ref 22–32)
Chloride: 107 mmol/L (ref 101–111)
Creatinine, Ser: 0.99 mg/dL (ref 0.44–1.00)
GFR calc Af Amer: >60 mL/min (ref >60)
GFR calc non Af Amer: 53 mL/min — ABNORMAL LOW (ref >60)
GLUCOSE: 119 mg/dL — AB (ref 65–99)
Potassium: 3.8 mmol/L (ref 3.5–5.1)
SODIUM: 136 mmol/L (ref 135–145)
TOTAL PROTEIN: 7.5 g/dL (ref 6.5–8.1)

## 2016-01-30 LAB — CBC WITH DIFFERENTIAL/PLATELET
BASOS ABS: 0.1 10*3/uL (ref 0.0–0.1)
Basophils Relative: 1 %
EOS ABS: 0.1 10*3/uL (ref 0.0–0.7)
EOS PCT: 1 %
HCT: 46.6 % — ABNORMAL HIGH (ref 36.0–46.0)
Hemoglobin: 15.8 g/dL — ABNORMAL HIGH (ref 12.0–15.0)
LYMPHS PCT: 23 %
Lymphs Abs: 2.4 10*3/uL (ref 0.7–4.0)
MCH: 32 pg (ref 26.0–34.0)
MCHC: 33.9 g/dL (ref 30.0–36.0)
MCV: 94.3 fL (ref 78.0–100.0)
MONO ABS: 0.9 10*3/uL (ref 0.1–1.0)
Monocytes Relative: 9 %
Neutro Abs: 6.9 10*3/uL (ref 1.7–7.7)
Neutrophils Relative %: 66 %
PLATELETS: 183 10*3/uL (ref 150–400)
RBC: 4.94 MIL/uL (ref 3.87–5.11)
RDW: 13.8 % (ref 11.5–15.5)
WBC: 10.3 10*3/uL (ref 4.0–10.5)

## 2016-01-30 LAB — URIC ACID: Uric Acid, Serum: 5.1 mg/dL (ref 2.3–6.6)

## 2016-01-30 LAB — CK: Total CK: 41 U/L (ref 38–234)

## 2016-01-30 MED ORDER — HYDROCODONE-ACETAMINOPHEN 5-325 MG PO TABS
1.0000 | ORAL_TABLET | Freq: Once | ORAL | Status: AC
  Administered 2016-01-30: 1 via ORAL
  Filled 2016-01-30: qty 1

## 2016-01-30 MED ORDER — DOXYCYCLINE HYCLATE 100 MG PO CAPS: 100.0000 mg | ORAL_CAPSULE | Freq: Two times a day (BID) | ORAL | Status: DC

## 2016-01-30 MED ORDER — PREDNISONE 50 MG PO TABS: ORAL_TABLET | ORAL | Status: DC

## 2016-01-30 MED ORDER — HYDROCODONE-ACETAMINOPHEN 5-325 MG PO TABS: 1.0000 | ORAL_TABLET | ORAL | Status: DC | PRN

## 2016-01-30 NOTE — ED Provider Notes (Signed)
CSN: [id number]     Arrival date & time 01/30/16  1810 History  By signing my name below, I, Octavia Heirrianna Nassar, attest that this documentation has been prepared under the direction and in the presence of Glynn OctaveStephen Rupa Lagan, MD. Electronically Signed: Octavia HeirArianna Nassar, ED Scribe. 01/30/2016. 8:32 PM.    Chief Complaint  Patient presents with  . Arm Injury      The history is provided by the patient. No language interpreter was used.   HPI Comments: Melanie Fields is a 82 y.o. female who has a PMHx of DM, HTN, COPD, gout, cirrhosis and back pain presents to the Emergency Department complaining of sudden onset, gradual worsening, moderate, right wrist pain that radiates to her right shoulder pain onset yesterday morning. Pt reports yesterday she woke up yesterday with extreme pain in her right wrist. She expresses increased pain with any movement of her wrist. Pt notes not having his problem before but notes having gout in her feet a few years ago. Per granddaughter, pt currently takes gabapentin. She has not taken any medication to alleviate her pain. She wears 2 L/min [place] O2 at home for her COPD. She denies hx of MI/heart problems, falls, injuries, chest pain, fever, vomiting, or abdominal pain. Pt is a smoker.   Past Medical History  Diagnosis Date  . Diabetes mellitus   . Hypertension   . Cirrhosis (HCC)   . Back pain    History reviewed. No pertinent past surgical history. History reviewed. No pertinent family history. Social History  Substance Use Topics  . Smoking status: Current Every Day Smoker -- 0.50 packs/day    Types: Cigarettes  . Smokeless tobacco: None  . Alcohol Use: No   OB History    No data available     Review of Systems  A complete 10 system review of systems was obtained and all systems are negative except as noted in the HPI and PMH.    Allergies  Sulfonamide derivatives and Penicillins  Home Medications   Prior to Admission medications   Medication Sig Start  Date End Date Taking? Authorizing Provider  albuterol (PROVENTIL HFA) 108 (90 BASE) MCG/ACT inhaler Inhale 1 puff into the lungs every 4 (four) hours as needed for wheezing or shortness of breath.     Historical Provider, MD  albuterol (PROVENTIL HFA;VENTOLIN HFA) 108 (90 BASE) MCG/ACT inhaler Inhale 2 puffs into the lungs every 4 (four) hours as needed for wheezing or shortness of breath. 09/09/14   Kristen N Ward, DO  ciprofloxacin (CIPRO) 500 MG tablet Take 1 tablet (500 mg total) by mouth 2 (two) times daily. 10/04/15   Linwood DibblesJon Knapp, MD  clopidogrel (PLAVIX) 75 MG tablet Take 75 mg by mouth daily.      Historical Provider, MD  glipiZIDE (GLUCOTROL) 10 MG tablet Take 10 mg by mouth daily before breakfast.    Historical Provider, MD  ibuprofen (ADVIL,MOTRIN) 200 MG tablet Take 400 mg by mouth every 6 (six) hours as needed for mild pain.    Historical Provider, MD  loperamide (IMODIUM) 2 MG capsule Take 1 capsule (2 mg total) by mouth 4 (four) times daily as needed for diarrhea or loose stools. 09/09/14   Kristen N Ward, DO  meclizine (ANTIVERT) 25 MG tablet Take 25 mg by mouth 3 (three) times daily as needed for dizziness or nausea.    Historical Provider, MD  ondansetron (ZOFRAN ODT) 4 MG disintegrating tablet Take 1 tablet (4 mg total) by mouth every 8 (eight) hours  as needed for nausea or vomiting. 09/09/14   Kristen N Ward, DO  predniSONE (DELTASONE) 20 MG tablet Take 3 tablets (60 mg total) by mouth daily. 09/09/14   Kristen N Ward, DO  traMADol (ULTRAM) 50 MG tablet Take 50 mg by mouth every 6 (six) hours as needed for moderate pain.    Historical Provider, MD   Triage vitals: BP 149/96 mmHg  Pulse 95  Temp(Src) 98 F (36.7 C) (Oral)  Resp 18  Ht 5\' 5"  (1.651 m)  Wt 160 lb (72.576 kg)  BMI 26.63 kg/m2  SpO2 96% Physical Exam  Constitutional: She is oriented to person, place, and time. She appears well-developed and well-nourished. No distress.  HENT:  Head: Normocephalic and atraumatic.   Mouth/Throat: Oropharynx is clear and moist. No oropharyngeal exudate.  Eyes: Conjunctivae and EOM are normal. Pupils are equal, round, and reactive to light.  Neck: Normal range of motion. Neck supple.  No meningismus.  Cardiovascular: Normal rate, regular rhythm, normal heart sounds and intact distal pulses.   No murmur heard. Pulmonary/Chest: Effort normal and breath sounds normal. No respiratory distress.  Decreased breath sounds  Abdominal: Soft. There is no tenderness. There is no rebound and no guarding.  Musculoskeletal: Normal range of motion. She exhibits no edema or tenderness.  Right wrist with edema and erythema over ulnar side Pain with ROM Intact radial pulse,  Intact cardinal hand movements Full ROM of shoulder and elbow  Neurological: She is alert and oriented to person, place, and time. No cranial nerve deficit. She exhibits normal muscle tone. Coordination normal.  No ataxia on finger to nose bilaterally. No pronator drift. 5/5 strength throughout. CN 2-12 intact.Equal grip strength. Sensation intact.   Skin: Skin is warm.  Psychiatric: She has a normal mood and affect. Her behavior is normal.  Nursing note and vitals reviewed.     ED Course  Procedures  DIAGNOSTIC STUDIES: Oxygen Saturation is 96% on 2 L/min [place], adequate by my interpretation.  COORDINATION OF CARE:  8:24 PM Discussed treatment plan which includes lab work and pain medication with pt at bedside and pt agreed to plan.  Labs Review Labs Reviewed  CBC WITH DIFFERENTIAL/PLATELET - Abnormal; Notable for the following:    Hemoglobin 15.8 (*)    HCT 46.6 (*)    All other components within normal limits  COMPREHENSIVE METABOLIC PANEL - Abnormal; Notable for the following:    CO2 21 (*)    Glucose, Bld 119 (*)    AST 14 (*)    ALT 9 (*)    Alkaline Phosphatase 128 (*)    GFR calc non Af Amer 53 (*)    All other components within normal limits  CK  URIC ACID    Imaging Review Dg Wrist  Complete Right  01/30/2016  CLINICAL DATA:  Right hand and shoulder erythema, pain and swelling for 2 days. No known injury. Initial encounter. EXAM: RIGHT WRIST - COMPLETE 3+ VIEW COMPARISON:  None. FINDINGS: The bones are demineralized. There is no evidence of acute fracture, dislocation or bone destruction. There are mild degenerative changes in the wrist with an intraosseous cyst in the radial aspect of the lunate. There is a bony excrescence from the radial styloid, probably posttraumatic. The soft tissues are prominent on the lateral view without apparent focal swelling. IMPRESSION: No acute osseous findings. Possible generalized soft tissue swelling. Electronically Signed   By: Carey Bullocks M.D.   On: 01/30/2016 19:00   I have personally reviewed and  evaluated these images and lab results as part of my medical decision-making.   EKG Interpretation None      MDM   Final diagnoses:  Wrist pain, acute, right  R wrist pain since yesterday morning.  Denies trauma.  Pain radiates to R shoulder. Hx gout in past. No fever or vomiting.  Erythema to R lateral wrist with reduced ROM.  No significant effusion. Radial pulse intact. Xray negative. No leukocytosis, afebrile. Uric acid normal.  Gout considered, despite normal uric acid. Early cellulitis possible. No effusion, able to range wrist, low suspicion for septic joint.  Patient anxious to leave.  Feels better after prednisone and norco.   Advised recheck by PCP Or ED in 48 hours.  Return sooner with worsening fever, pain, spreading redness, or any other concerns. Treat for possible gout, possibly early cellulitis.     I personally performed the services described in this documentation, which was scribed in my presence. The recorded information has been reviewed and is accurate.   Glynn Octave, MD 01/31/16 [phone]

## 2016-01-30 NOTE — Discharge Instructions (Signed)
Gout Follow up for a recheck in 2 days in the ED or with Dr. Romeo Apple. Return to the ED if he develop worsening pain, spreading redness, fever, unable to move your wrists or any other concerns.- Gout is an inflammatory arthritis caused by a buildup of uric acid crystals in the joints. Uric acid is a chemical that is normally present in the blood. When the level of uric acid in the blood is too high it can form crystals that deposit in your joints and tissues. This causes joint redness, soreness, and swelling (inflammation). Repeat attacks are common. Over time, uric acid crystals can form into masses (tophi) near a joint, destroying bone and causing disfigurement. Gout is treatable and often preventable. CAUSES  The disease begins with elevated levels of uric acid in the blood. Uric acid is produced by your body when it breaks down a naturally found substance called purines. Certain foods you eat, such as meats and fish, contain high amounts of purines. Causes of an elevated uric acid level include:  Being passed down from parent to child (heredity).  Diseases that cause increased uric acid production (such as obesity, psoriasis, and certain cancers).  Excessive alcohol use.  Diet, especially diets rich in meat and seafood.  Medicines, including certain cancer-fighting medicines (chemotherapy), water pills (diuretics), and aspirin.  Chronic kidney disease. The kidneys are no longer able to remove uric acid well.  Problems with metabolism. Conditions strongly associated with gout include:  Obesity.  High blood pressure.  High cholesterol.  Diabetes. Not everyone with elevated uric acid levels gets gout. It is not understood why some people get gout and others do not. Surgery, joint injury, and eating too much of certain foods are some of the factors that can lead to gout attacks. SYMPTOMS   An attack of gout comes on quickly. It causes intense pain with redness, swelling, and warmth in  a joint.  Fever can occur.  Often, only one joint is involved. Certain joints are more commonly involved:  Base of the big toe.  Knee.  Ankle.  Wrist.  Finger. Without treatment, an attack usually goes away in a few days to weeks. Between attacks, you usually will not have symptoms, which is different from many other forms of arthritis. DIAGNOSIS  Your caregiver will suspect gout based on your symptoms and exam. In some cases, tests may be recommended. The tests may include:  Blood tests.  Urine tests.  X-rays.  Joint fluid exam. This exam requires a needle to remove fluid from the joint (arthrocentesis). Using a microscope, gout is confirmed when uric acid crystals are seen in the joint fluid. TREATMENT  There are two phases to gout treatment: treating the sudden onset (acute) attack and preventing attacks (prophylaxis).  Treatment of an Acute Attack.  Medicines are used. These include anti-inflammatory medicines or steroid medicines.  An injection of steroid medicine into the affected joint is sometimes necessary.  The painful joint is rested. Movement can worsen the arthritis.  You may use warm or cold treatments on painful joints, depending which works best for you.  Treatment to Prevent Attacks.  If you suffer from frequent gout attacks, your caregiver may advise preventive medicine. These medicines are started after the acute attack subsides. These medicines either help your kidneys eliminate uric acid from your body or decrease your uric acid production. You may need to stay on these medicines for a very long time.  The early phase of treatment with preventive medicine can be  associated with an increase in acute gout attacks. For this reason, during the first few months of treatment, your caregiver may also advise you to take medicines usually used for acute gout treatment. Be sure you understand your caregiver's directions. Your caregiver may make several  adjustments to your medicine dose before these medicines are effective.  Discuss dietary treatment with your caregiver or dietitian. Alcohol and drinks high in sugar and fructose and foods such as meat, poultry, and seafood can increase uric acid levels. Your caregiver or dietitian can advise you on drinks and foods that should be limited. HOME CARE INSTRUCTIONS   Do not take aspirin to relieve pain. This raises uric acid levels.  Only take over-the-counter or prescription medicines for pain, discomfort, or fever as directed by your caregiver.  Rest the joint as much as possible. When in bed, keep sheets and blankets off painful areas.  Keep the affected joint raised (elevated).  Apply warm or cold treatments to painful joints. Use of warm or cold treatments depends on which works best for you.  Use crutches if the painful joint is in your leg.  Drink enough fluids to keep your urine clear or pale yellow. This helps your body get rid of uric acid. Limit alcohol, sugary drinks, and fructose drinks.  Follow your dietary instructions. Pay careful attention to the amount of protein you eat. Your daily diet should emphasize fruits, vegetables, whole grains, and fat-free or low-fat milk products. Discuss the use of coffee, vitamin C, and cherries with your caregiver or dietitian. These may be helpful in lowering uric acid levels.  Maintain a healthy body weight. SEEK MEDICAL CARE IF:   You develop diarrhea, vomiting, or any side effects from medicines.  You do not feel better in 24 hours, or you are getting worse. SEEK IMMEDIATE MEDICAL CARE IF:   Your joint becomes suddenly more tender, and you have chills or a fever. MAKE SURE YOU:   Understand these instructions.  Will watch your condition.  Will get help right away if you are not doing well or get worse.   This information is not intended to replace advice given to you by your health care provider. Make sure you discuss any  questions you have with your health care provider.   Document Released: 08/21/2000 Document Revised: 09/14/2014 Document Reviewed: 04/06/2012 Elsevier Interactive Patient Education [employer]2016 [employer]

## 2016-01-30 NOTE — ED Notes (Signed)
Pt states her right hand and shoulder have been red and swollen and painful with no known injury for 2 days.

## 2016-10-12 DIAGNOSIS — J449 Chronic obstructive pulmonary disease, unspecified: Secondary | ICD-10-CM | POA: Diagnosis not present

## 2016-11-09 DIAGNOSIS — J449 Chronic obstructive pulmonary disease, unspecified: Secondary | ICD-10-CM | POA: Diagnosis not present

## 2016-12-05 ENCOUNTER — Encounter (HOSPITAL_COMMUNITY): Payer: Self-pay | Admitting: Cardiology

## 2016-12-05 ENCOUNTER — Emergency Department (HOSPITAL_COMMUNITY): Payer: Medicare Other

## 2016-12-05 ENCOUNTER — Inpatient Hospital Stay (HOSPITAL_COMMUNITY)
Admission: EM | Admit: 2016-12-05 | Discharge: 2017-01-05 | DRG: 871 | Disposition: E | Payer: Medicare Other | Attending: Internal Medicine | Admitting: Internal Medicine

## 2016-12-05 DIAGNOSIS — J441 Chronic obstructive pulmonary disease with (acute) exacerbation: Secondary | ICD-10-CM | POA: Diagnosis not present

## 2016-12-05 DIAGNOSIS — I4891 Unspecified atrial fibrillation: Secondary | ICD-10-CM | POA: Diagnosis not present

## 2016-12-05 DIAGNOSIS — E872 Acidosis, unspecified: Secondary | ICD-10-CM | POA: Diagnosis present

## 2016-12-05 DIAGNOSIS — D696 Thrombocytopenia, unspecified: Secondary | ICD-10-CM | POA: Diagnosis not present

## 2016-12-05 DIAGNOSIS — R0602 Shortness of breath: Secondary | ICD-10-CM | POA: Diagnosis not present

## 2016-12-05 DIAGNOSIS — A4151 Sepsis due to Escherichia coli [E. coli]: Principal | ICD-10-CM | POA: Diagnosis present

## 2016-12-05 DIAGNOSIS — I21A1 Myocardial infarction type 2: Secondary | ICD-10-CM | POA: Diagnosis present

## 2016-12-05 DIAGNOSIS — Z7984 Long term (current) use of oral hypoglycemic drugs: Secondary | ICD-10-CM

## 2016-12-05 DIAGNOSIS — Z66 Do not resuscitate: Secondary | ICD-10-CM | POA: Diagnosis present

## 2016-12-05 DIAGNOSIS — Z9981 Dependence on supplemental oxygen: Secondary | ICD-10-CM

## 2016-12-05 DIAGNOSIS — A419 Sepsis, unspecified organism: Secondary | ICD-10-CM | POA: Diagnosis present

## 2016-12-05 DIAGNOSIS — I214 Non-ST elevation (NSTEMI) myocardial infarction: Secondary | ICD-10-CM | POA: Diagnosis present

## 2016-12-05 DIAGNOSIS — E11649 Type 2 diabetes mellitus with hypoglycemia without coma: Secondary | ICD-10-CM | POA: Diagnosis not present

## 2016-12-05 DIAGNOSIS — R319 Hematuria, unspecified: Secondary | ICD-10-CM | POA: Diagnosis present

## 2016-12-05 DIAGNOSIS — R748 Abnormal levels of other serum enzymes: Secondary | ICD-10-CM

## 2016-12-05 DIAGNOSIS — Z8249 Family history of ischemic heart disease and other diseases of the circulatory system: Secondary | ICD-10-CM

## 2016-12-05 DIAGNOSIS — J9621 Acute and chronic respiratory failure with hypoxia: Secondary | ICD-10-CM | POA: Diagnosis not present

## 2016-12-05 DIAGNOSIS — K7460 Unspecified cirrhosis of liver: Secondary | ICD-10-CM | POA: Diagnosis present

## 2016-12-05 DIAGNOSIS — R6520 Severe sepsis without septic shock: Secondary | ICD-10-CM | POA: Diagnosis not present

## 2016-12-05 DIAGNOSIS — Z7902 Long term (current) use of antithrombotics/antiplatelets: Secondary | ICD-10-CM

## 2016-12-05 DIAGNOSIS — Z882 Allergy status to sulfonamides status: Secondary | ICD-10-CM | POA: Diagnosis not present

## 2016-12-05 DIAGNOSIS — N390 Urinary tract infection, site not specified: Secondary | ICD-10-CM | POA: Diagnosis present

## 2016-12-05 DIAGNOSIS — R404 Transient alteration of awareness: Secondary | ICD-10-CM | POA: Diagnosis not present

## 2016-12-05 DIAGNOSIS — R109 Unspecified abdominal pain: Secondary | ICD-10-CM

## 2016-12-05 DIAGNOSIS — Z808 Family history of malignant neoplasm of other organs or systems: Secondary | ICD-10-CM

## 2016-12-05 DIAGNOSIS — Z880 Allergy status to penicillin: Secondary | ICD-10-CM

## 2016-12-05 DIAGNOSIS — R0682 Tachypnea, not elsewhere classified: Secondary | ICD-10-CM | POA: Diagnosis not present

## 2016-12-05 DIAGNOSIS — R000 Tachycardia, unspecified: Secondary | ICD-10-CM | POA: Diagnosis not present

## 2016-12-05 DIAGNOSIS — R0902 Hypoxemia: Secondary | ICD-10-CM | POA: Diagnosis present

## 2016-12-05 DIAGNOSIS — I482 Chronic atrial fibrillation: Secondary | ICD-10-CM | POA: Diagnosis not present

## 2016-12-05 DIAGNOSIS — I4510 Unspecified right bundle-branch block: Secondary | ICD-10-CM | POA: Diagnosis present

## 2016-12-05 DIAGNOSIS — J449 Chronic obstructive pulmonary disease, unspecified: Secondary | ICD-10-CM | POA: Diagnosis not present

## 2016-12-05 DIAGNOSIS — R1084 Generalized abdominal pain: Secondary | ICD-10-CM

## 2016-12-05 DIAGNOSIS — R809 Proteinuria, unspecified: Secondary | ICD-10-CM | POA: Diagnosis not present

## 2016-12-05 DIAGNOSIS — Z1623 Resistance to quinolones and fluoroquinolones: Secondary | ICD-10-CM

## 2016-12-05 DIAGNOSIS — Z515 Encounter for palliative care: Secondary | ICD-10-CM

## 2016-12-05 DIAGNOSIS — N179 Acute kidney failure, unspecified: Secondary | ICD-10-CM | POA: Diagnosis present

## 2016-12-05 DIAGNOSIS — F17210 Nicotine dependence, cigarettes, uncomplicated: Secondary | ICD-10-CM | POA: Diagnosis present

## 2016-12-05 DIAGNOSIS — Z7952 Long term (current) use of systemic steroids: Secondary | ICD-10-CM

## 2016-12-05 DIAGNOSIS — I10 Essential (primary) hypertension: Secondary | ICD-10-CM | POA: Diagnosis not present

## 2016-12-05 DIAGNOSIS — Z7189 Other specified counseling: Secondary | ICD-10-CM

## 2016-12-05 DIAGNOSIS — R7989 Other specified abnormal findings of blood chemistry: Secondary | ICD-10-CM

## 2016-12-05 DIAGNOSIS — R778 Other specified abnormalities of plasma proteins: Secondary | ICD-10-CM

## 2016-12-05 DIAGNOSIS — E8729 Other acidosis: Secondary | ICD-10-CM

## 2016-12-05 DIAGNOSIS — E860 Dehydration: Secondary | ICD-10-CM | POA: Diagnosis present

## 2016-12-05 DIAGNOSIS — E876 Hypokalemia: Secondary | ICD-10-CM | POA: Diagnosis not present

## 2016-12-05 LAB — CBC WITH DIFFERENTIAL/PLATELET
Basophils Absolute: 0 10*3/uL (ref 0.0–0.1)
Basophils Relative: 0 %
EOS ABS: 0 10*3/uL (ref 0.0–0.7)
EOS PCT: 0 %
HCT: 50.6 % — ABNORMAL HIGH (ref 36.0–46.0)
Hemoglobin: 16.2 g/dL — ABNORMAL HIGH (ref 12.0–15.0)
LYMPHS ABS: 4.2 10*3/uL — AB (ref 0.7–4.0)
Lymphocytes Relative: 38 %
MCH: 31.5 pg (ref 26.0–34.0)
MCHC: 32 g/dL (ref 30.0–36.0)
MCV: 98.4 fL (ref 78.0–100.0)
Monocytes Absolute: 0.7 10*3/uL (ref 0.1–1.0)
Monocytes Relative: 7 %
Neutro Abs: 6.1 10*3/uL (ref 1.7–7.7)
Neutrophils Relative %: 55 %
Platelets: 118 10*3/uL — ABNORMAL LOW (ref 150–400)
RBC: 5.14 MIL/uL — ABNORMAL HIGH (ref 3.87–5.11)
RDW: 15.3 % (ref 11.5–15.5)
WBC: 11.1 10*3/uL — AB (ref 4.0–10.5)

## 2016-12-05 LAB — BLOOD GAS, ARTERIAL
Acid-base deficit: 5.6 mmol/L — ABNORMAL HIGH (ref 0.0–2.0)
BICARBONATE: 20.1 mmol/L (ref 20.0–28.0)
DRAWN BY: 221791
Delivery systems: POSITIVE
Drawn by: 105551
Expiratory PAP: 6
FIO2: 50
Inspiratory PAP: 14
O2 Content: 2 L/min
O2 Saturation: 50.1 %
O2 Saturation: 89.3 %
PO2 ART: 38.9 mmHg — AB (ref 83.0–108.0)
PO2 ART: 57.7 mmHg — AB (ref 83.0–108.0)
Patient temperature: 37
RATE: 14 resp/min
pCO2 arterial: 32.1 mmHg (ref 32.0–48.0)
pH, Arterial: 7.062 — CL (ref 7.350–7.450)
pH, Arterial: 7.38 (ref 7.350–7.450)

## 2016-12-05 LAB — RAPID URINE DRUG SCREEN, HOSP PERFORMED
AMPHETAMINES: NOT DETECTED
BENZODIAZEPINES: NOT DETECTED
Barbiturates: NOT DETECTED
Cocaine: NOT DETECTED
OPIATES: NOT DETECTED
TETRAHYDROCANNABINOL: NOT DETECTED

## 2016-12-05 LAB — MRSA PCR SCREENING: MRSA BY PCR: NEGATIVE

## 2016-12-05 LAB — TROPONIN I
TROPONIN I: 0.25 ng/mL — AB (ref <0.03)
TROPONIN I: 0.6 ng/mL — AB (ref <0.03)
TROPONIN I: 0.47 ng/mL — AB (ref <0.03)

## 2016-12-05 LAB — COMPREHENSIVE METABOLIC PANEL
ALT: 15 U/L (ref 14–54)
AST: 102 U/L — ABNORMAL HIGH (ref 15–41)
Albumin: 3.4 g/dL — ABNORMAL LOW (ref 3.5–5.0)
Alkaline Phosphatase: 73 U/L (ref 38–126)
Anion gap: 24 — ABNORMAL HIGH (ref 5–15)
BUN: 13 mg/dL (ref 6–20)
CO2: 9 mmol/L — AB (ref 22–32)
Calcium: 9.2 mg/dL (ref 8.9–10.3)
Chloride: 104 mmol/L (ref 101–111)
Creatinine, Ser: 1.54 mg/dL — ABNORMAL HIGH (ref 0.44–1.00)
GFR calc non Af Amer: 31 mL/min — ABNORMAL LOW (ref >60)
GFR, EST AFRICAN AMERICAN: 36 mL/min — AB (ref >60)
Glucose, Bld: 367 mg/dL — ABNORMAL HIGH (ref 65–99)
POTASSIUM: 3.7 mmol/L (ref 3.5–5.1)
SODIUM: 137 mmol/L (ref 135–145)
Total Bilirubin: 0.8 mg/dL (ref 0.3–1.2)
Total Protein: 6.5 g/dL (ref 6.5–8.1)

## 2016-12-05 LAB — URINALYSIS, ROUTINE W REFLEX MICROSCOPIC
Bilirubin Urine: NEGATIVE
GLUCOSE, UA: 250 mg/dL — AB
LEUKOCYTES UA: NEGATIVE
NITRITE: POSITIVE — AB
Protein, ur: 100 mg/dL — AB
Specific Gravity, Urine: >1.03 — ABNORMAL HIGH (ref 1.005–1.030)
pH: 6 (ref 5.0–8.0)

## 2016-12-05 LAB — CBG MONITORING, ED
GLUCOSE-CAPILLARY: 218 mg/dL — AB (ref 65–99)
Glucose-Capillary: 331 mg/dL — ABNORMAL HIGH (ref 65–99)

## 2016-12-05 LAB — GLUCOSE, CAPILLARY
GLUCOSE-CAPILLARY: 199 mg/dL — AB (ref 65–99)
GLUCOSE-CAPILLARY: 165 mg/dL — AB (ref 65–99)
GLUCOSE-CAPILLARY: 124 mg/dL — AB (ref 65–99)

## 2016-12-05 LAB — URINALYSIS, MICROSCOPIC (REFLEX)

## 2016-12-05 LAB — LACTIC ACID, PLASMA
Lactic Acid, Venous: 8.9 mmol/L (ref 0.5–1.9)
Lactic Acid, Venous: 4.5 mmol/L (ref 0.5–1.9)

## 2016-12-05 LAB — HEPARIN LEVEL (UNFRACTIONATED): Heparin Unfractionated: 0.68 IU/mL (ref 0.30–0.70)

## 2016-12-05 LAB — LIPASE, BLOOD: Lipase: 17 U/L (ref 11–51)

## 2016-12-05 LAB — AMMONIA: AMMONIA: 34 umol/L (ref 9–35)

## 2016-12-05 MED ORDER — IPRATROPIUM-ALBUTEROL 0.5-2.5 (3) MG/3ML IN SOLN
RESPIRATORY_TRACT | Status: AC
  Administered 2016-12-05: 3 mL via RESPIRATORY_TRACT
  Filled 2016-12-05: qty 3

## 2016-12-05 MED ORDER — IPRATROPIUM-ALBUTEROL 0.5-2.5 (3) MG/3ML IN SOLN
3.0000 mL | Freq: Once | RESPIRATORY_TRACT | Status: AC
  Administered 2016-12-05: 3 mL via RESPIRATORY_TRACT

## 2016-12-05 MED ORDER — STERILE WATER FOR INJECTION IV SOLN
INTRAVENOUS | Status: DC
  Administered 2016-12-05: 14:00:00 via INTRAVENOUS
  Filled 2016-12-05 (×7): qty 9.71

## 2016-12-05 MED ORDER — DILTIAZEM HCL 100 MG IV SOLR
INTRAVENOUS | Status: AC
  Filled 2016-12-05: qty 100

## 2016-12-05 MED ORDER — DILTIAZEM HCL-DEXTROSE 100-5 MG/100ML-% IV SOLN (PREMIX)
5.0000 mg/h | Freq: Once | INTRAVENOUS | Status: AC
  Administered 2016-12-05: 5 mg/h via INTRAVENOUS
  Filled 2016-12-05: qty 100

## 2016-12-05 MED ORDER — SODIUM CHLORIDE 0.9 % IV BOLUS (SEPSIS)
1000.0000 mL | Freq: Once | INTRAVENOUS | Status: AC
  Administered 2016-12-05: 1000 mL via INTRAVENOUS

## 2016-12-05 MED ORDER — ONDANSETRON HCL 4 MG/2ML IJ SOLN: 4.0000 mg | Freq: Four times a day (QID) | INTRAMUSCULAR | Status: DC | PRN

## 2016-12-05 MED ORDER — DEXTROSE 5 % IV SOLN
1.0000 g | Freq: Once | INTRAVENOUS | Status: AC
  Administered 2016-12-05: 1 g via INTRAVENOUS
  Filled 2016-12-05: qty 10

## 2016-12-05 MED ORDER — HEPARIN (PORCINE) IN NACL 100-0.45 UNIT/ML-% IJ SOLN
1000.0000 [IU]/h | INTRAMUSCULAR | Status: DC
  Administered 2016-12-05: 1100 [IU]/h via INTRAVENOUS
  Filled 2016-12-05: qty 250

## 2016-12-05 MED ORDER — LORAZEPAM 2 MG/ML IJ SOLN
0.5000 mg | Freq: Once | INTRAMUSCULAR | Status: AC
  Administered 2016-12-05: 0.5 mg via INTRAVENOUS

## 2016-12-05 MED ORDER — CEFTRIAXONE SODIUM 1 G IJ SOLR
1.0000 g | Freq: Once | INTRAMUSCULAR | Status: AC
  Administered 2016-12-05: 1 g via INTRAVENOUS
  Filled 2016-12-05: qty 10

## 2016-12-05 MED ORDER — NYSTATIN 500000 UNITS PO TABS: 500000.0000 [IU] | ORAL_TABLET | Freq: Three times a day (TID) | ORAL | Status: DC

## 2016-12-05 MED ORDER — HALOPERIDOL LACTATE 5 MG/ML IJ SOLN
5.0000 mg | Freq: Once | INTRAMUSCULAR | Status: AC
  Administered 2016-12-05: 5 mg via INTRAMUSCULAR
  Filled 2016-12-05: qty 1

## 2016-12-05 MED ORDER — SODIUM BICARBONATE 8.4 % IV SOLN: INTRAVENOUS | Status: DC

## 2016-12-05 MED ORDER — SODIUM BICARBONATE 8.4 % IV SOLN
50.0000 meq | Freq: Once | INTRAVENOUS | Status: DC
Start: 2016-12-05 — End: 2016-12-08

## 2016-12-05 MED ORDER — LORAZEPAM 2 MG/ML IJ SOLN
1.0000 mg | Freq: Once | INTRAMUSCULAR | Status: AC
  Administered 2016-12-05: 1 mg via INTRAVENOUS
  Filled 2016-12-05: qty 1

## 2016-12-05 MED ORDER — HEPARIN SODIUM (PORCINE) 5000 UNIT/ML IJ SOLN
4000.0000 [IU] | Freq: Once | INTRAMUSCULAR | Status: AC
  Administered 2016-12-05: 4000 [IU] via INTRAVENOUS
  Filled 2016-12-05: qty 1

## 2016-12-05 MED ORDER — DEXTROSE 5 % IV SOLN
2.0000 g | INTRAVENOUS | Status: DC
  Administered 2016-12-06: 2 g via INTRAVENOUS
  Filled 2016-12-05 (×2): qty 2

## 2016-12-05 MED ORDER — DILTIAZEM HCL-DEXTROSE 100-5 MG/100ML-% IV SOLN (PREMIX)
5.0000 mg/h | INTRAVENOUS | Status: DC
  Administered 2016-12-05: 15 mg/h via INTRAVENOUS
  Administered 2016-12-06: 10 mg/h via INTRAVENOUS
  Administered 2016-12-06 (×2): 15 mg/h via INTRAVENOUS
  Administered 2016-12-07: 7.5 mg/h via INTRAVENOUS
  Administered 2016-12-07 (×2): 15 mg/h via INTRAVENOUS
  Filled 2016-12-05 (×6): qty 100

## 2016-12-05 MED ORDER — LORAZEPAM 2 MG/ML IJ SOLN
INTRAMUSCULAR | Status: AC
Start: 2016-12-05 — End: 2016-12-06
  Filled 2016-12-05: qty 1

## 2016-12-05 MED ORDER — SODIUM CHLORIDE 0.9 % IV BOLUS (SEPSIS)
500.0000 mL | Freq: Once | INTRAVENOUS | Status: AC
  Administered 2016-12-05: 500 mL via INTRAVENOUS

## 2016-12-05 MED ORDER — CEFTRIAXONE SODIUM 1 G IJ SOLR
INTRAMUSCULAR | Status: AC
  Filled 2016-12-05: qty 10

## 2016-12-05 MED ORDER — LORAZEPAM 2 MG/ML IJ SOLN
0.5000 mg | Freq: Once | INTRAMUSCULAR | Status: AC
  Administered 2016-12-05: 0.5 mg via INTRAVENOUS
  Filled 2016-12-05: qty 1

## 2016-12-05 MED ORDER — NYSTATIN 100000 UNIT/GM EX POWD
Freq: Two times a day (BID) | CUTANEOUS | Status: DC
  Administered 2016-12-05 – 2016-12-08 (×5): via TOPICAL
  Filled 2016-12-05: qty 15

## 2016-12-05 MED ORDER — ONDANSETRON HCL 4 MG PO TABS: 4.0000 mg | ORAL_TABLET | Freq: Four times a day (QID) | ORAL | Status: DC | PRN

## 2016-12-05 MED ORDER — IPRATROPIUM-ALBUTEROL 0.5-2.5 (3) MG/3ML IN SOLN
3.0000 mL | Freq: Four times a day (QID) | RESPIRATORY_TRACT | Status: DC
Start: 2016-12-05 — End: 2016-12-07
  Administered 2016-12-05 – 2016-12-07 (×9): 3 mL via RESPIRATORY_TRACT
  Filled 2016-12-05 (×9): qty 3

## 2016-12-05 MED ORDER — SODIUM BICARBONATE 8.4 % IV SOLN
50.0000 meq | Freq: Once | INTRAVENOUS | Status: AC
  Administered 2016-12-05: 50 meq via INTRAVENOUS
  Filled 2016-12-05: qty 50

## 2016-12-05 MED ORDER — INSULIN ASPART 100 UNIT/ML ~~LOC~~ SOLN
0.0000 [IU] | SUBCUTANEOUS | Status: DC
  Administered 2016-12-05 – 2016-12-07 (×3): 2 [IU] via SUBCUTANEOUS
  Administered 2016-12-07: 1 [IU] via SUBCUTANEOUS

## 2016-12-05 NOTE — ED Notes (Signed)
Report given to [place], RN for room ICU-10. PT to go upstairs after ABG is drawn.

## 2016-12-05 NOTE — ED Notes (Signed)
CRITICAL VALUE ALERT  Critical value received:  Blood gas: Co2 <reportable, ph 7.062, po2 38.9, so2 50.1  Date of notification:  2016-12-27  Time of notification:  0957  Critical value read back:Yes.    Nurse who received alert:  RMinter, RN  MD notified (1st page):  Dr. Adriana Simas  Time of first page:  0957  MD notified (2nd page):  Time of second page:  Responding MD:  Dr. Adriana Simas  Time MD responded:  [phone]

## 2016-12-05 NOTE — ED Notes (Signed)
  CBG 218  

## 2016-12-05 NOTE — ED Notes (Signed)
CRITICAL VALUE ALERT  Critical value received:  Troponin 0.25  Date of notification:  11/10/2016  Time of notification:  0912  Critical value read back:Yes.    Nurse who received alert:  RMinter, RN  MD notified (1st page):  Dr. Adriana Simas  Time of first page:  0912  MD notified (2nd page):  Time of second page:  Responding MD:  Dr. Adriana Simas  Time MD responded:  [phone]

## 2016-12-05 NOTE — Progress Notes (Signed)
D/W Dr. Adriana Simas (ER attending) regarding elevated troponin - this appears to be in the setting of hypoxia, a-fib with RVR, profound acidemia and possible UTI. Not a candidate for acute cardiac intervention - would recommend rate-control of a-fib, treatment of medical co-morbidities and could consider heparinization if not contraindicated. Cardiology could see if needed in consultation on Monday. No indication for acute transfer to [facility] for cardiac services at this time.  Chrystie Nose, MD, [facility] Attending Cardiologist [facility]

## 2016-12-05 NOTE — Progress Notes (Signed)
CRITICAL VALUE ALERT  Critical value received: troponin 0.60, Lactic Acid 4.5  Date of notification: 01/01/17  Time of notification: 1805  Critical value read back:yes  Nurse who received alert:  E.Suriah Peragine RN  MD notified (1st page):  Dr. Mariea Clonts  Time of first page:  1807  MD notified (2nd page):  Time of second page:  Responding MD: Dr. Mariea Clonts  Time MD responded: [phone]

## 2016-12-05 NOTE — Progress Notes (Signed)
ANTICOAGULATION CONSULT NOTE   Pharmacy Consult for heparin Indication: atrial fibrillation  Allergies  Allergen Reactions  . Sulfonamide Derivatives Itching  . Penicillins Rash    Has patient had a PCN reaction causing immediate rash, facial/tongue/throat swelling, SOB or lightheadedness with hypotension: Yes Has patient had a PCN reaction causing severe rash involving mucus membranes or skin necrosis: No Has patient had a PCN reaction that required hospitalization No Has patient had a PCN reaction occurring within the last 10 years: No If all of the above answers are "NO", then may proceed with Cephalosporin use.     Patient Measurements: Height:  (157.5 cm) Weight: 128 lb 15.5 oz (58.5 kg) IBW/kg (Calculated) : 50.1 Heparin Dosing Weight: 72 kg  Vital Signs: Temp: 99 F (37.2 C) (03/31 1415) Temp Source: Rectal (03/31 1415) BP: 105/70 (03/31 1730) Pulse Rate: 49 (03/31 1700)  Labs:  Recent Labs  12-19-16 0822 2016/12/19 1653 2016-12-19 1705  HGB 16.2*  --   --   HCT 50.6*  --   --   PLT 118*  --   --   HEPARINUNFRC  --  0.68  --   CREATININE 1.54*  --   --   TROPONINI 0.25*  --  0.60*    Estimated Creatinine Clearance: 23 mL/min (A) (by C-G formula based on SCr of 1.54 mg/dL (H)).   Medical History: Past Medical History:  Diagnosis Date  . Back pain   . Cirrhosis (HCC)   . Diabetes mellitus   . Hypertension     Medications:  See medication history  Assessment: 83 yo lady to start heparin for afib.  She was not on anticoagulation PTA.  Baseline PTLC is 118K.  Chadsvasc score 5. Initial heparin level is therapeutic Goal of Therapy:  Heparin level 0.3-0.7 units/ml Monitor platelets by anticoagulation protocol: Yes   Plan:  Continue heparin drip at 1100 units/hr Check heparin level and CBC daily while on heparin Monitor for bleeding complications  Thanks for allowing pharmacy to be a part of this patient's care.  Talbert Cage, PharmD Clinical  Pharmacist 2016/12/19,6:30 PM

## 2016-12-05 NOTE — ED Triage Notes (Signed)
Per family Pt went to bathroom this morning without oxygen around 6 am.  Pt was not responding well .  Family waited 90 min before calling EMS.  Per EMS  CBG 322.  Unable to obtain a pulse ox on pt.  EMS put pt on non rebreather and pt was responding better.  EKG showed a fib rate 160's.  b/p  132/74

## 2016-12-05 NOTE — ED Provider Notes (Signed)
[facility]Y DEPT Provider Note   CSN: [id number] Arrival date & time: 12/01/2016  0807     History   Chief Complaint Chief Complaint  Patient presents with  . Altered Mental Status    HPI Melanie Fields is a 83 y.o. female.  Level V caveat for altered mental status. Apparently, patient went to the commode this morning approximate 6 AM without her oxygen. She was on the toilet for approximately 90 minutes when the family noticed that she was confused and disoriented. EMS was called. Her blood pressure was stabl,e but she was tachycardic and in atrial fibrillation. She seemed to respond well to oxygen. She is in poor health with a history of cirrhosis, diabetes, hypertension. Her granddaughter states that she is typically confused. No prodromal illnesses.      Past Medical History:  Diagnosis Date  . Back pain   . Cirrhosis (HCC)   . Diabetes mellitus   . Hypertension     Patient Active Problem List   Diagnosis Date Noted  . Sepsis (HCC) 11/10/2016  . NSTEMI (non-ST elevated myocardial infarction) (HCC) 11/14/2016  . UTI (urinary tract infection) 12/01/2016  . Atrial fibrillation with rapid ventricular response (HCC) 11/07/2016  . Metabolic acidosis 11/24/2016  . COPD (chronic obstructive pulmonary disease) (HCC) 12/03/2016  . AKI (acute kidney injury) (HCC) 11/09/2016  . Hypoxemia 12/02/2016  . PLANTAR FACIITIS 02/20/2009  . PES PLANUS 02/20/2009  . CLOSED FRACTURE OF LATERAL MALLEOLUS 11/28/2008  . DIABETES 09/12/2007  . LOW BACK PAIN 09/12/2007  . SCIATICA 09/12/2007  . BACK PAIN 09/12/2007  . HIGH BLOOD PRESSURE 09/12/2007    History reviewed. No pertinent surgical history.  OB History    No data available       Home Medications    Prior to Admission medications   Medication Sig Start Date End Date Taking? Authorizing Provider  albuterol (PROVENTIL HFA) 108 (90 BASE) MCG/ACT inhaler Inhale 1 puff into the lungs every 4 (four) hours as needed for  wheezing or shortness of breath.     Historical Provider, MD  albuterol (PROVENTIL HFA;VENTOLIN HFA) 108 (90 BASE) MCG/ACT inhaler Inhale 2 puffs into the lungs every 4 (four) hours as needed for wheezing or shortness of breath. Patient not taking: Reported on 01/30/2016 09/09/14   Layla Maw Ward, DO  ciprofloxacin (CIPRO) 500 MG tablet Take 1 tablet (500 mg total) by mouth 2 (two) times daily. Patient not taking: Reported on 01/30/2016 10/04/15   Linwood Dibbles, MD  clopidogrel (PLAVIX) 75 MG tablet Take 75 mg by mouth daily.      Historical Provider, MD  glipiZIDE (GLUCOTROL) 10 MG tablet Take 10 mg by mouth daily before breakfast.    Historical Provider, MD  HYDROcodone-acetaminophen (NORCO/VICODIN) 5-325 MG tablet Take 1 tablet by mouth every 4 (four) hours as needed. 01/30/16   Glynn Octave, MD  ibuprofen (ADVIL,MOTRIN) 200 MG tablet Take 400 mg by mouth every 6 (six) hours as needed for mild pain.    Historical Provider, MD  loperamide (IMODIUM) 2 MG capsule Take 1 capsule (2 mg total) by mouth 4 (four) times daily as needed for diarrhea or loose stools. Patient not taking: Reported on 01/30/2016 09/09/14   Layla Maw Ward, DO  meclizine (ANTIVERT) 25 MG tablet Take 25 mg by mouth 3 (three) times daily as needed for dizziness or nausea.    Historical Provider, MD  ondansetron (ZOFRAN ODT) 4 MG disintegrating tablet Take 1 tablet (4 mg total) by mouth every 8 (eight) hours  as needed for nausea or vomiting. Patient not taking: Reported on 01/30/2016 09/09/14   Layla Maw Ward, DO  predniSONE (DELTASONE) 50 MG tablet 1 tablet PO daily 01/30/16   Glynn Octave, MD  traMADol (ULTRAM) 50 MG tablet Take 50 mg by mouth every 6 (six) hours as needed for moderate pain.    Historical Provider, MD    Family History History reviewed. No pertinent family history.  Social History Social History  Substance Use Topics  . Smoking status: Current Every Day Smoker    Packs/day: 0.50    Types: Cigarettes  . Smokeless  tobacco: Current User  . Alcohol use No     Allergies   Sulfonamide derivatives and Penicillins   Review of Systems Review of Systems  Reason unable to perform ROS: ams.     Physical Exam Updated Vital Signs BP (!) 99/59   Pulse 63   Temp 98.3 F (36.8 C) (Axillary)   Resp (!) 21   Ht  (1.575 m)   Wt 128 lb 15.5 oz (58.5 kg)   SpO2 91%   BMI 23.59 kg/m   Physical Exam  Constitutional:  Looks ill, confused  HENT:  Head: Normocephalic and atraumatic.  Eyes: Conjunctivae are normal.  Neck: Neck supple.  Cardiovascular:  Tachycardic  Pulmonary/Chest: Effort normal and breath sounds normal.  Abdominal: Soft. Bowel sounds are normal.  Musculoskeletal: Normal range of motion.  Neurological:  Moving all extremities  Skin: Skin is warm and dry.  Psychiatric:  Confused  Nursing note and vitals reviewed.    ED Treatments / Results  Labs (all labs ordered are listed, but only abnormal results are displayed) Labs Reviewed  CBC WITH DIFFERENTIAL/PLATELET - Abnormal; Notable for the following:       Result Value   WBC 11.1 (*)    RBC 5.14 (*)    Hemoglobin 16.2 (*)    HCT 50.6 (*)    Platelets 118 (*)    Lymphs Abs 4.2 (*)    All other components within normal limits  COMPREHENSIVE METABOLIC PANEL - Abnormal; Notable for the following:    CO2 9 (*)    Glucose, Bld 367 (*)    Creatinine, Ser 1.54 (*)    Albumin 3.4 (*)    AST 102 (*)    GFR calc non Af Amer 31 (*)    GFR calc Af Amer 36 (*)    Anion gap 24 (*)    All other components within normal limits  TROPONIN I - Abnormal; Notable for the following:    Troponin I 0.25 (*)    All other components within normal limits  URINALYSIS, ROUTINE W REFLEX MICROSCOPIC - Abnormal; Notable for the following:    Specific Gravity, Urine >1.030 (*)    Glucose, UA 250 (*)    Hgb urine dipstick LARGE (*)    Ketones, ur TRACE (*)    Protein, ur 100 (*)    Nitrite POSITIVE (*)    All other components within  normal limits  BLOOD GAS, ARTERIAL - Abnormal; Notable for the following:    pH, Arterial 7.062 (*)    pO2, Arterial 38.9 (*)    Allens test (pass/fail) NOT INDICATED (*)    All other components within normal limits  URINALYSIS, MICROSCOPIC (REFLEX) - Abnormal; Notable for the following:    Bacteria, UA MANY (*)    Squamous Epithelial / LPF 0-5 (*)    All other components within normal limits  LACTIC ACID, PLASMA - Abnormal;  Notable for the following:    Lactic Acid, Venous 8.9 (*)    All other components within normal limits  LACTIC ACID, PLASMA - Abnormal; Notable for the following:    Lactic Acid, Venous 4.5 (*)    All other components within normal limits  TROPONIN I - Abnormal; Notable for the following:    Troponin I 0.60 (*)    All other components within normal limits  TROPONIN I - Abnormal; Notable for the following:    Troponin I 0.47 (*)    All other components within normal limits  GLUCOSE, CAPILLARY - Abnormal; Notable for the following:    Glucose-Capillary 199 (*)    All other components within normal limits  BLOOD GAS, ARTERIAL - Abnormal; Notable for the following:    pO2, Arterial 57.7 (*)    Acid-base deficit 5.6 (*)    Allens test (pass/fail) NOT INDICATED (*)    All other components within normal limits  HEPARIN LEVEL (UNFRACTIONATED) - Abnormal; Notable for the following:    Heparin Unfractionated 0.85 (*)    All other components within normal limits  BASIC METABOLIC PANEL - Abnormal; Notable for the following:    Potassium 3.2 (*)    CO2 20 (*)    Calcium 7.8 (*)    All other components within normal limits  CBC - Abnormal; Notable for the following:    Hemoglobin 15.2 (*)    Platelets 59 (*)    All other components within normal limits  GLUCOSE, CAPILLARY - Abnormal; Notable for the following:    Glucose-Capillary 165 (*)    All other components within normal limits  GLUCOSE, CAPILLARY - Abnormal; Notable for the following:    Glucose-Capillary  124 (*)    All other components within normal limits  GLUCOSE, CAPILLARY - Abnormal; Notable for the following:    Glucose-Capillary 115 (*)    All other components within normal limits  CBG MONITORING, ED - Abnormal; Notable for the following:    Glucose-Capillary 331 (*)    All other components within normal limits  CBG MONITORING, ED - Abnormal; Notable for the following:    Glucose-Capillary 218 (*)    All other components within normal limits  CULTURE, BLOOD (ROUTINE X 2)  CULTURE, BLOOD (ROUTINE X 2)  MRSA PCR SCREENING  URINE CULTURE  LIPASE, BLOOD  RAPID URINE DRUG SCREEN, HOSP PERFORMED  AMMONIA  HEPARIN LEVEL (UNFRACTIONATED)  GLUCOSE, CAPILLARY  GLUCOSE, CAPILLARY  GLUCOSE, CAPILLARY  LACTIC ACID, PLASMA  MAGNESIUM  CBC    EKG  EKG Interpretation  Date/Time:  Saturday December 05 2016 08:48:56 EDT Ventricular Rate:  129 PR Interval:    QRS Duration: 147 QT Interval:  345 QTC Calculation: 506 R Axis:   92 Text Interpretation:  Atrial fibrillation Right bundle branch block Repol abnrm suggests ischemia, diffuse leads Confirmed by Adriana Simas  MD, Aidan Moten ([id number]) on 12/03/2016 9:23:31 AM       Radiology Ct Head Wo Contrast  Result Date: 11/25/2016 CLINICAL DATA:  Acute mental status change. EXAM: CT HEAD WITHOUT CONTRAST TECHNIQUE: Contiguous axial images were obtained from the base of the skull through the vertex without intravenous contrast. COMPARISON:  October 03, 2015 FINDINGS: Brain: No subdural, epidural, or subarachnoid hemorrhage. Cerebellum and basal cisterns are normal. Low attenuation in the pons is likely artifactual. Volume loss with prominence of the sulci is stable. The ventricles are unchanged. No mass, mass effect, or midline shift. Scattered white matter changes. No acute cortical ischemia or infarct. Vascular:  No hyperdense vessel or unexpected calcification. Skull: Normal. Negative for fracture or focal lesion. Sinuses/Orbits: No acute finding. Other:  None. IMPRESSION: Chronic white matter changes. No acute cortical ischemia or infarct. No bleed identified. Electronically Signed   By: Gerome Sam III M.D   On: 11/12/2016 11:15   Dg Chest Port 1 View  Result Date: 12/02/2016 CLINICAL DATA:  Altered mental status, combative EXAM: PORTABLE CHEST 1 VIEW COMPARISON:  10/03/2015 FINDINGS: The lungs are hyperinflated likely secondary to COPD. There is no focal parenchymal opacity. There is no pleural effusion or pneumothorax. There is stable cardiomegaly. There is mild bilateral interstitial prominence unchanged in the prior exam. The osseous structures are unremarkable. IMPRESSION: No active disease. Electronically Signed   By: Elige Ko   On: 11/15/2016 08:57    Procedures Procedures (including critical care time)  Medications Ordered in ED Medications  ondansetron (ZOFRAN) tablet 4 mg (not administered)    Or  ondansetron (ZOFRAN) injection 4 mg (not administered)  insulin aspart (novoLOG) injection 0-9 Units (0 Units Subcutaneous Not Given 12/06/16 0800)  sodium bicarbonate injection 50 mEq (not administered)  ipratropium-albuterol (DUONEB) 0.5-2.5 (3) MG/3ML nebulizer solution 3 mL (3 mLs Nebulization Given 12/06/16 0733)  nystatin (MYCOSTATIN/NYSTOP) topical powder ( Topical Given 11/21/2016 1617)  cefTRIAXone (ROCEPHIN) 2 g in dextrose 5 % 50 mL IVPB (not administered)  diltiazem (CARDIZEM) 100 mg in dextrose 5% (1 mg/mL) infusion (15 mg/hr Intravenous Rate/Dose Verify 12/06/16 0600)  dextrose 5 % and 0.45 % NaCl with KCl 40 mEq/L infusion ( Intravenous New Bag/Given 12/06/16 0831)  sodium chloride 0.9 % bolus 1,000 mL (0 mLs Intravenous Stopped 11/28/2016 1015)  ipratropium-albuterol (DUONEB) 0.5-2.5 (3) MG/3ML nebulizer solution 3 mL (3 mLs Nebulization Given 11/12/2016 0839)  sodium chloride 0.9 % bolus 500 mL (0 mLs Intravenous Stopped 11/16/2016 0945)  LORazepam (ATIVAN) injection 0.5 mg (0.5 mg Intravenous Given 12/02/2016 1010)  cefTRIAXone  (ROCEPHIN) 1 g in dextrose 5 % 50 mL IVPB (0 g Intravenous Stopped 11/26/2016 1230)  diltiazem (CARDIZEM) 100 mg in dextrose 5% (1 mg/mL) infusion (15 mg/hr Intravenous Rate/Dose Change 11/16/2016 1500)  heparin injection 4,000 Units (4,000 Units Intravenous Given 11/08/2016 1227)  sodium chloride 0.9 % bolus 1,000 mL (1,000 mLs Intravenous New Bag/Given 12/04/2016 1231)  LORazepam (ATIVAN) injection 0.5 mg (0.5 mg Intravenous Given 11/18/2016 1227)  sodium bicarbonate injection 50 mEq (50 mEq Intravenous Given 11/18/2016 1236)  sodium chloride 0.9 % bolus 1,000 mL (1,000 mLs Intravenous New Bag/Given 11/23/2016 1337)  haloperidol lactate (HALDOL) injection 5 mg (5 mg Intramuscular Given 11/20/2016 1534)  cefTRIAXone (ROCEPHIN) 1 g in dextrose 5 % 50 mL IVPB (1 g Intravenous Given 11/13/2016 1930)  sodium chloride 0.9 % bolus 500 mL (500 mLs Intravenous Given 11/30/2016 1838)  LORazepam (ATIVAN) injection 1 mg (1 mg Intravenous Given 11/17/2016 1858)  dextrose 5 % with diltiazem (CARDIZEM) ADS Med (  Duplicate 11/24/2016 2144)  dextrose 50 % solution (25 mLs Intravenous Given 12/06/16 1610)     Initial Impression / Assessment and Plan / ED Course  I have reviewed the triage vital signs and the nursing notes.  Pertinent labs & imaging results that were available during my care of the patient were reviewed by me and considered in my medical decision making (see chart for details).   CRITICAL CARE Performed by: Donnetta Hutching Total critical care time: 45 minutes Critical care time was exclusive of separately billable procedures and treating other patients. Critical care was necessary to treat or prevent  imminent or life-threatening deterioration. Critical care was time spent personally by me on the following activities: development of treatment plan with patient and/or surrogate as well as nursing, discussions with consultants, evaluation of patient's response to treatment, examination of patient, obtaining history from patient  or surrogate, ordering and performing treatments and interventions, ordering and review of laboratory studies, ordering and review of radiographic studies, pulse oximetry and re-evaluation of patient's condition.    Patient is critically ill. Son confirms DO NOT RESUSCITATE or DO NOT INTUBATE. BiPAP initiated in response to respiratory acidosis. Will start Cardizem for rate control. Troponin is elevated. This was discussed with the cardiologist in [place]. No aggressive intervention at this time. Will Rx for a urinary tract infection. Admit to general medicine.  Final Clinical Impressions(s) / ED Diagnoses   Final diagnoses:  Respiratory acidosis  Chronic obstructive pulmonary disease, unspecified COPD type (HCC)  Atrial fibrillation, unspecified type (HCC)  Elevated troponin  Urinary tract infection without hematuria, site unspecified    New Prescriptions Current Discharge Medication List       Donnetta Hutching, MD 12/06/16 [phone]

## 2016-12-05 NOTE — H&P (Addendum)
History and Physical    Melanie Fields XBM:[id number] DOB: 02-Jul-1934 DOA: 11/28/2016  PCP: Pearson Grippe, MD  Patient coming from: Home.  Chief Complaint: Unresponsiveness  HPI: Melanie Fields is a 83 y.o. female with medical history significant of COPD on 2L home O2, HTN, DM, Liver cirrhosis-?etiology. History was obtained from daughter and granddaughter, patient is on BiPAP, and then to questions but awake, restless. Per family- patient's workup at about 6 AM this morning to use the Bathroom, is on the commode for about 90 minutes, without taking her oxygen along. Daughter checked on patient and now found her to be awake but not responding and could not move. Patient is on 2 L of oxygen, she on many occasions will not use O2.  Over the past week family reports patient has been seen it is her time to go, she has been progressively week over the past 2 weeks, daughter notes chills this morning, both denies fevers, dysuria or frequency. Patient shortness of breath, and nonproductive cough are stable.  Patient still smokes cigs, family denies alcohol history.   ED Course: In the ED patient's heart rate was in the 130s, in A. fib with RVR, ABG with pH of 7.06, PCO2- below reportable range, Po2- 38, troponin 0.25, CMP- CO2 9, AG-24, normal ammonia levels-34, negative UDS, UA- positive for many bacteria and nitrites. Head CT and chest x-ray negative for acute abnormality.  Review of Systems: Per family- Daughter and grand-daughter. CONSTITUTIONAL- No Fever, no weightloss, or change in appetite. SKIN- rash groin region and beneath bilateral breasts. HEAD- No Headache, has chronic dizziness RESPIRATORY- Stable non productive cough, and SOB. CARDIAC- No Palpitations,no chest pain. GI- No nausea, vomiting, diarrhoea, constipation, or abd pain. URINARY- No Frequency, urgency, or dysuria. NEUROLOGIC- Chronic lower extremity burning.  Past Medical History:  Diagnosis Date  . Back pain   . Cirrhosis (HCC)     . Diabetes mellitus   . Hypertension    History reviewed. No pertinent surgical history.   reports that she has been smoking Cigarettes.  She has been smoking about 0.50 packs per day. She does not have any smokeless tobacco history on file. She reports that she does not drink alcohol or use drugs.  Allergies  Allergen Reactions  . Sulfonamide Derivatives Itching  . Penicillins Rash    Has patient had a PCN reaction causing immediate rash, facial/tongue/throat swelling, SOB or lightheadedness with hypotension: Yes Has patient had a PCN reaction causing severe rash involving mucus membranes or skin necrosis: No Has patient had a PCN reaction that required hospitalization No Has patient had a PCN reaction occurring within the last 10 years: No If all of the above answers are "NO", then may proceed with Cephalosporin use.     History reviewed. No pertinent family history.  Prior to Admission medications   Medication Sig Start Date End Date Taking? Authorizing Provider  albuterol (PROVENTIL HFA) 108 (90 BASE) MCG/ACT inhaler Inhale 1 puff into the lungs every 4 (four) hours as needed for wheezing or shortness of breath.     Historical Provider, MD  albuterol (PROVENTIL HFA;VENTOLIN HFA) 108 (90 BASE) MCG/ACT inhaler Inhale 2 puffs into the lungs every 4 (four) hours as needed for wheezing or shortness of breath. Patient not taking: Reported on 01/30/2016 09/09/14   Layla Maw Ward, DO  ciprofloxacin (CIPRO) 500 MG tablet Take 1 tablet (500 mg total) by mouth 2 (two) times daily. Patient not taking: Reported on 01/30/2016 10/04/15   Linwood Dibbles,  MD  clopidogrel (PLAVIX) 75 MG tablet Take 75 mg by mouth daily.      Historical Provider, MD  doxycycline (VIBRAMYCIN) 100 MG capsule Take 1 capsule (100 mg total) by mouth 2 (two) times daily. 01/30/16   Glynn Octave, MD  glipiZIDE (GLUCOTROL) 10 MG tablet Take 10 mg by mouth daily before breakfast.    Historical Provider, MD   HYDROcodone-acetaminophen (NORCO/VICODIN) 5-325 MG tablet Take 1 tablet by mouth every 4 (four) hours as needed. 01/30/16   Glynn Octave, MD  ibuprofen (ADVIL,MOTRIN) 200 MG tablet Take 400 mg by mouth every 6 (six) hours as needed for mild pain.    Historical Provider, MD  loperamide (IMODIUM) 2 MG capsule Take 1 capsule (2 mg total) by mouth 4 (four) times daily as needed for diarrhea or loose stools. Patient not taking: Reported on 01/30/2016 09/09/14   Layla Maw Ward, DO  meclizine (ANTIVERT) 25 MG tablet Take 25 mg by mouth 3 (three) times daily as needed for dizziness or nausea.    Historical Provider, MD  ondansetron (ZOFRAN ODT) 4 MG disintegrating tablet Take 1 tablet (4 mg total) by mouth every 8 (eight) hours as needed for nausea or vomiting. Patient not taking: Reported on 01/30/2016 09/09/14   Layla Maw Ward, DO  predniSONE (DELTASONE) 50 MG tablet 1 tablet PO daily 01/30/16   Glynn Octave, MD  traMADol (ULTRAM) 50 MG tablet Take 50 mg by mouth every 6 (six) hours as needed for moderate pain.    Historical Provider, MD   Physical Exam: Vitals:   12-09-2016 1130 12/09/16 1137 Dec 09, 2016 1230 12-09-16 1239  BP:   111/83   Pulse:  (!) 125 (!) 30 (!) 39  Resp: (!) 44 (!) 34 (!) 29 (!) 34  Temp:      TempSrc:      SpO2: 100% 100% 100% 100%    Constitutional: On Bipap, restless/agitation. Exam limited by patient's mental status. She is arousable not responding to questions. Vitals:   12/09/16 1130 12/09/2016 1137 12/09/16 1230 12/09/2016 1239  BP:   111/83   Pulse:  (!) 125 (!) 30 (!) 39  Resp: (!) 44 (!) 34 (!) 29 (!) 34  Temp:      TempSrc:      SpO2: 100% 100% 100% 100%   Eyes: PERRL ENMT: Mucous membranes are very Dry,  Reduced skin turgor, on BiPAP unable to examine pharynx  Neck: normal, supple, no masses, no thyromegaly Respiratory: clear to auscultation bilaterally, no wheezing, no crackles. On BiPAP  Cardiovascular: Irregular rhythm, tachycardic no murmurs / rubs / gallops.  No extremity edema. Abdomen: Minimal diffuse tenderness, no masses palpated. No hepatosplenomegaly. Bowel sounds positive.  Musculoskeletal: no clubbing / cyanosis. Arthritic changes DIP, PIP Skin: significant Bilateral erythematous rash- intetriginous areas- beneath bilateral breasts, beneath right abdominal pannus, areas all moist. Neurologic: Moving all extremities, to pain  Psychiatric: Unable to examine  Labs on Admission: I have personally reviewed following labs and imaging studies  CBC:  Recent Labs Lab 12/09/2016 0822  WBC 11.1*  NEUTROABS 6.1  HGB 16.2*  HCT 50.6*  MCV 98.4  PLT 118*   Basic Metabolic Panel:  Recent Labs Lab 09-Dec-2016 0822  NA 137  K 3.7  CL 104  CO2 9*  GLUCOSE 367*  BUN 13  CREATININE 1.54*  CALCIUM 9.2   Liver Function Tests:  Recent Labs Lab 12-09-2016 0822  AST 102*  ALT 15  ALKPHOS 73  BILITOT 0.8  PROT 6.5  ALBUMIN 3.4*  Recent Labs Lab 11/22/2016 0822  LIPASE 17    Recent Labs Lab 12/03/2016 0832  AMMONIA 34   Cardiac Enzymes:  Recent Labs Lab 12/03/2016 0822  TROPONINI 0.25*   CBG:  Recent Labs Lab 11/26/2016 0835  GLUCAP 331*   Urine analysis:    Component Value Date/Time   COLORURINE YELLOW 11/17/2016 1015   APPEARANCEUR CLEAR 11/14/2016 1015   LABSPEC >1.030 (H) 11/09/2016 1015   PHURINE 6.0 11/21/2016 1015   GLUCOSEU 250 (A) 11/06/2016 1015   HGBUR LARGE (A) 12/01/2016 1015   BILIRUBINUR NEGATIVE 11/11/2016 1015   KETONESUR TRACE (A) 11/07/2016 1015   PROTEINUR 100 (A) 11/07/2016 1015   UROBILINOGEN 0.2 09/09/2014 1457   NITRITE POSITIVE (A) 11/25/2016 1015   LEUKOCYTESUR NEGATIVE 11/20/2016 1015    Radiological Exams on Admission: Ct Head Wo Contrast  Result Date: 12/02/2016 CLINICAL DATA:  Acute mental status change. EXAM: CT HEAD WITHOUT CONTRAST TECHNIQUE: Contiguous axial images were obtained from the base of the skull through the vertex without intravenous contrast. COMPARISON:  October 03, 2015 FINDINGS: Brain: No subdural, epidural, or subarachnoid hemorrhage. Cerebellum and basal cisterns are normal. Low attenuation in the pons is likely artifactual. Volume loss with prominence of the sulci is stable. The ventricles are unchanged. No mass, mass effect, or midline shift. Scattered white matter changes. No acute cortical ischemia or infarct. Vascular: No hyperdense vessel or unexpected calcification. Skull: Normal. Negative for fracture or focal lesion. Sinuses/Orbits: No acute finding. Other: None. IMPRESSION: Chronic white matter changes. No acute cortical ischemia or infarct. No bleed identified. Electronically Signed   By: Gerome Sam III M.D   On: 11/07/2016 11:15   Dg Chest Port 1 View  Result Date: 12/02/2016 CLINICAL DATA:  Altered mental status, combative EXAM: PORTABLE CHEST 1 VIEW COMPARISON:  10/03/2015 FINDINGS: The lungs are hyperinflated likely secondary to COPD. There is no focal parenchymal opacity. There is no pleural effusion or pneumothorax. There is stable cardiomegaly. There is mild bilateral interstitial prominence unchanged in the prior exam. The osseous structures are unremarkable. IMPRESSION: No active disease. Electronically Signed   By: Elige Ko   On: 11/16/2016 08:57   EKG: Atria Fib with RVR, RBBB.  Assessment/Plan Principal Problem:   Sepsis (HCC) Active Problems:   NSTEMI (non-ST elevated myocardial infarction) (HCC)   UTI (urinary tract infection)   Atrial fibrillation with rapid ventricular response (HCC)   Metabolic acidosis   COPD (chronic obstructive pulmonary disease) (HCC)   AKI (acute kidney injury) (HCC)   Hypoxemia  Sepsis from UTI- with tachycardia, tachypnea, mild leukocytosis, UA- nitrites, many bacteria, significant lactic acidosis- 8.9. ABG- PH- 7.0.   - Admit to inpatient, stepdown for close monitoring - IV ceftriazone 2g daily,  - Draw blood cultures X2 - order Urine cultures - Stat lactic acid- 8.9, trend >> 4.5 -  Total of 3.5L N/s given  - IVF Bicarb with 1/4 Ns 125cc/hr - IV bicarb bolus 50 meq X1. - NPO for now pending improvement in mental status. - Insert foley - Stat Lactic acid  Afib with RVR- No prior hx. Likely provoked by sepsis, A Rates- 120s- 130s.  Cards consulted in ED- recs- Anticaog if not CI  and rate control. Not a candidate for cardioversion at this time, Cards can see on Monday in consultation. - Cont Cardizem,  - Cont heparin - ECHO - Trend trops.  Anion gap Metabolic acidosis- bicarbonate-9, anion gap 24, CBG 367, respiratory compensation PCO2- below reportable range. Likely due to  significant lactic acidosis, possibly AKI contributing, doubt DKA at her age with T2DD, without significant hyperglycemia.  - IV bicarbonate infusion - Bolus IV bicarbonate 50 mEq 2 - ABG recheck - Stat lactic acid  NSTEMI- likely due to demand ischemia- Sepsis, Acidosis, Afib with RVR. Trop 0.25, EKG- Afib with RVR, RBBB. Trop 0.25 >> 0.6. - Trend trops X2  - EKG am  Acute kidney injury- creatinine 1.5. BUN 15. Likely 2/2 sepsis, dehydration. Baseline 0.8-0.9. - IV fluids - BMP a.m. - UA- hematuria, proteinuria  COPD- on 2 L at home. Stable . Requiring BiPAP here- initially O2 sats 80s O2. Hypoxic on ABG- PO2- 38. Hypoxia may be due to severe sepsis, significant metabolic acidosis and mental status changes. Still smokes cigs - Duonebs - D/c Bipap pending ABG.  Home meds- Plavix- family doesn't know why she is on this medication. Gabapentin for neuropathic pain. Meclizine for dizziness. - Hold Plavix, meclizine, gabapentin  DVT prophylaxis: Therapeutic heparin   Code Status: DNR- Confirmed with daughter and grand daughter.  Family Communication: Present at beside- daughter and grand-daughter. Disposition Plan: pending clinical course  Consults called: Cardiology  Admission status: Inpt, Step down.  Prognosis- poor, considering patient's significant metabolic acidosis, lactic acidosis,  ongoing sepsis, multiple organ dysfunction- cardiac, renal. Explained to family severity of patient's illness and poor prognosis. Expressed understanding, and coughing patient's wishes to be DO NOT RESUSCITATE  Onnie Boer MD [facility] Pager [phone]  If 7PM-7AM, please contact night-coverage [email or web address] Password [email or web address]  Jan 04, 2017, 12:50 PM

## 2016-12-05 NOTE — Progress Notes (Signed)
ANTICOAGULATION CONSULT NOTE - Initial Consult  Pharmacy Consult for heparin Indication: atrial fibrillation  Allergies  Allergen Reactions  . Sulfonamide Derivatives Itching  . Penicillins Rash    Has patient had a PCN reaction causing immediate rash, facial/tongue/throat swelling, SOB or lightheadedness with hypotension: Yes Has patient had a PCN reaction causing severe rash involving mucus membranes or skin necrosis: No Has patient had a PCN reaction that required hospitalization No Has patient had a PCN reaction occurring within the last 10 years: No If all of the above answers are "NO", then may proceed with Cephalosporin use.     Patient Measurements:   Heparin Dosing Weight: 72 kg  Vital Signs: Temp: 99.4 F (37.4 C) (03/31 0824) Temp Source: Rectal (03/31 0824) BP: 119/70 (03/31 1030) Pulse Rate: 140 (03/31 1030)  Labs:  Recent Labs  Jan 04, 2017 0822  HGB 16.2*  HCT 50.6*  PLT 118*  CREATININE 1.54*  TROPONINI 0.25*    CrCl cannot be calculated (Unknown ideal weight.).   Medical History: Past Medical History:  Diagnosis Date  . Back pain   . Cirrhosis (HCC)   . Diabetes mellitus   . Hypertension     Medications:  See medication history  Assessment: 83 yo lady to start heparin for afib.  She was not on anticoagulation PTA.  Baseline PTLC is 118K.  Chadsvasc score 5. Goal of Therapy:  Heparin level 0.3-0.7 units/ml Monitor platelets by anticoagulation protocol: Yes   Plan:  Heparin bolus 4000 units and drip at 1100 units/hr Check heparin level and CBC 6 hours after start and daily while on heparin Monitor for bleeding complications  Thanks for allowing pharmacy to be a part of this patient's care.  Talbert Cage, PharmD Clinical Pharmacist 01/04/2017,11:26 AM

## 2016-12-06 ENCOUNTER — Inpatient Hospital Stay (HOSPITAL_COMMUNITY): Payer: Medicare Other

## 2016-12-06 DIAGNOSIS — I4891 Unspecified atrial fibrillation: Secondary | ICD-10-CM

## 2016-12-06 DIAGNOSIS — J449 Chronic obstructive pulmonary disease, unspecified: Secondary | ICD-10-CM

## 2016-12-06 LAB — BASIC METABOLIC PANEL
Anion gap: 11 (ref 5–15)
BUN: 12 mg/dL (ref 6–20)
CO2: 20 mmol/L — ABNORMAL LOW (ref 22–32)
Calcium: 7.8 mg/dL — ABNORMAL LOW (ref 8.9–10.3)
Chloride: 110 mmol/L (ref 101–111)
Creatinine, Ser: 0.85 mg/dL (ref 0.44–1.00)
GFR calc Af Amer: >60 mL/min (ref >60)
Glucose, Bld: 76 mg/dL (ref 65–99)
Potassium: 3.2 mmol/L — ABNORMAL LOW (ref 3.5–5.1)
SODIUM: 141 mmol/L (ref 135–145)

## 2016-12-06 LAB — ECHOCARDIOGRAM COMPLETE
Height: 62 in
Weight: 2063.51 oz

## 2016-12-06 LAB — GLUCOSE, CAPILLARY
GLUCOSE-CAPILLARY: 115 mg/dL — AB (ref 65–99)
GLUCOSE-CAPILLARY: 75 mg/dL (ref 65–99)
GLUCOSE-CAPILLARY: 95 mg/dL (ref 65–99)
Glucose-Capillary: 67 mg/dL (ref 65–99)
Glucose-Capillary: 74 mg/dL (ref 65–99)
Glucose-Capillary: 77 mg/dL (ref 65–99)
Glucose-Capillary: 91 mg/dL (ref 65–99)

## 2016-12-06 LAB — CBC
HCT: 45.7 % (ref 36.0–46.0)
HCT: 42.9 % (ref 36.0–46.0)
Hemoglobin: 15.2 g/dL — ABNORMAL HIGH (ref 12.0–15.0)
Hemoglobin: 14.5 g/dL (ref 12.0–15.0)
MCH: 31.1 pg (ref 26.0–34.0)
MCH: 31.3 pg (ref 26.0–34.0)
MCHC: 33.3 g/dL (ref 30.0–36.0)
MCHC: 33.8 g/dL (ref 30.0–36.0)
MCV: 93.6 fL (ref 78.0–100.0)
MCV: 92.7 fL (ref 78.0–100.0)
PLATELETS: 59 10*3/uL — AB (ref 150–400)
PLATELETS: 66 10*3/uL — AB (ref 150–400)
RBC: 4.88 MIL/uL (ref 3.87–5.11)
RBC: 4.63 MIL/uL (ref 3.87–5.11)
RDW: 15.5 % (ref 11.5–15.5)
RDW: 15.3 % (ref 11.5–15.5)
WBC: 10.1 10*3/uL (ref 4.0–10.5)
WBC: 10.1 10*3/uL (ref 4.0–10.5)

## 2016-12-06 LAB — LACTIC ACID, PLASMA
LACTIC ACID, VENOUS: 6.5 mmol/L — AB (ref 0.5–1.9)
LACTIC ACID, VENOUS: 3.2 mmol/L — AB (ref 0.5–1.9)
Lactic Acid, Venous: 2.3 mmol/L (ref 0.5–1.9)

## 2016-12-06 LAB — HEPARIN LEVEL (UNFRACTIONATED): HEPARIN UNFRACTIONATED: 0.85 [IU]/mL — AB (ref 0.30–0.70)

## 2016-12-06 LAB — MAGNESIUM: MAGNESIUM: 1.5 mg/dL — AB (ref 1.7–2.4)

## 2016-12-06 MED ORDER — DEXTROSE 50 % IV SOLN
INTRAVENOUS | Status: AC
  Administered 2016-12-06: 25 mL via INTRAVENOUS
  Filled 2016-12-06: qty 50

## 2016-12-06 MED ORDER — LORAZEPAM 2 MG/ML IJ SOLN
1.0000 mg | Freq: Once | INTRAMUSCULAR | Status: AC
  Administered 2016-12-06: 1 mg via INTRAVENOUS
  Filled 2016-12-06: qty 1

## 2016-12-06 MED ORDER — POTASSIUM CHLORIDE 20 MEQ PO PACK
40.0000 meq | PACK | ORAL | Status: DC
  Filled 2016-12-06 (×2): qty 2

## 2016-12-06 MED ORDER — MAGNESIUM SULFATE 2 GM/50ML IV SOLN
2.0000 g | Freq: Once | INTRAVENOUS | Status: AC
  Administered 2016-12-06: 2 g via INTRAVENOUS
  Filled 2016-12-06: qty 50

## 2016-12-06 MED ORDER — KCL IN DEXTROSE-NACL 40-5-0.45 MEQ/L-%-% IV SOLN
INTRAVENOUS | Status: DC
  Administered 2016-12-06 (×2): via INTRAVENOUS

## 2016-12-06 MED ORDER — POTASSIUM CHLORIDE CRYS ER 20 MEQ PO TBCR
40.0000 meq | EXTENDED_RELEASE_TABLET | ORAL | Status: AC
  Administered 2016-12-06 (×2): 40 meq via ORAL
  Filled 2016-12-06 (×2): qty 2

## 2016-12-06 MED ORDER — NICOTINE 14 MG/24HR TD PT24
14.0000 mg | MEDICATED_PATCH | Freq: Every day | TRANSDERMAL | Status: DC
  Administered 2016-12-06 – 2016-12-08 (×3): 14 mg via TRANSDERMAL
  Filled 2016-12-06 (×3): qty 1

## 2016-12-06 NOTE — Progress Notes (Signed)
Hypoglycemic Event  CBG: 67  Treatment: D50 25ml IVP  Symptoms: Asymptomatic  Follow-up CBG: Time: 0035 CBG Result:  115   Possible Reasons for Event:  Patient NPO  Comments/MD notified: hypoglycemic protocol followed    Faylene Million

## 2016-12-06 NOTE — Progress Notes (Signed)
Took patient off Bipap due to patient being very agitated.  Placed patient on HFNC at 8 LPM, patient still some agitated but not as bad as when on bipap.  Sats are 94-95%.  Will continue to monitor.

## 2016-12-06 NOTE — Progress Notes (Signed)
Pt lactic acid value came back critical at 6.5 after trending down to 2.3 after several draws. Called back to lab to request redraw to confirm the value is correct. Will let MD know if value is confirmed.

## 2016-12-06 NOTE — Progress Notes (Signed)
PROGRESS NOTE    Melanie Fields  ZOX:[id number] DOB: [birth year] DOA: 12/01/2016 PCP: Pearson Grippe, MD   Brief Narrative: Melanie Fields is a 83 y.o. female with medical history significant of COPD on 2L home O2, HTN, DM, Liver cirrhosis-?etiology. History was obtained from daughter and granddaughter, patient is on BiPAP, and then to questions but awake, restless. Per family- patient's workup at about 6 AM this morning to use the Bathroom, is on the commode for about 90 minutes, without taking her oxygen along. Daughter checked on patient and now found her to be awake but not responding and could not move. Patient is on 2 L of oxygen, she on many occasions will not use O2.  Over the past week family reports patient has been seen it is her time to go, she has been progressively week over the past 2 weeks, daughter notes chills this morning, both denies fevers, dysuria or frequency. Patient shortness of breath, and nonproductive cough are stable.  Patient still smokes cigs, family denies alcohol history.   ED Course: In the ED patient's heart rate was in the 130s, in A. fib with RVR, ABG with pH of 7.06, PCO2- below reportable range, Po2- 38, troponin 0.25, CMP- CO2 9, AG-24, normal ammonia levels-34, negative UDS, UA- positive for many bacteria and nitrites. Head CT and chest x-ray negative for acute abnormality.  Assessment & Plan:   Principal Problem:   Sepsis (HCC) Active Problems:   NSTEMI (non-ST elevated myocardial infarction) (HCC)   UTI (urinary tract infection)   Atrial fibrillation with rapid ventricular response (HCC)   Metabolic acidosis   COPD (chronic obstructive pulmonary disease) (HCC)   AKI (acute kidney injury) (HCC)   Hypoxemia  Sepsis from UTI- Improving. Urine culture >100 000 GNR.  significant lactic acidosis- 8.9. ABG- PH- 7.0.   - Cont IV ceftriazone 2g daily,  - blood cultures X2 NGTD - F/u Urine cultures >>  - Stat lactic acid- 8.9, trend >> 4.5>> 2.3 - mental status  has signifcantly improved. Regular diet for now, switch to carb mod tomorrow with hypoglycemia. - Foley - transfer to floor am  Afib with RVR- No prior hx. Likely provoked by sepsis, A Rates- 120s- 130s.  Cards consulted in ED- recs- Anticaog if not CI  and rate control. Not a candidate for cardioversion at this time, Cards can see on Monday in consultation. - Cont Cardizem, goal <110 HR, to allow for Bp support - D/c heparin with thrombocytopenia- 59,  - ECHO pending - Consult Cards  Anion gap Metabolic acidosis- resolved, bicarbonate-9, anion gap 24, CBG 367, respiratory compensation PCO2- below reportable range. Likely due to significant lactic acidosis, possibly AKI contributing - IV bicarbonate infusion- D/c  - IVF d5-1/2 ns +KCL 75cc  NSTEMI- likely due to demand ischemia- Sepsis, Acidosis, Afib with RVR. Trop 0.25, EKG- Afib with RVR, RBBB. Trop 0.25 >> 0.6 >>0.4. Repeat EKG- rbbb, A. Fib.  - now down trending, can  D/c trop check  Acute kidney injury- resolved, back to baseline. creatinine 1.5. BUN 15. Likely 2/2 sepsis, dehydration. Baseline 0.8-0.9. - IV fluids - BMP a.m. - UA- hematuria, proteinuria  COPD- on 2 L at home. Stable . Requiring BiPAP here- initially O2 sats 80s O2. Hypoxic on ABG- PO2- 38. Hypoxia may be due to severe sepsis, significant metabolic acidosis and mental status changes. Still smokes cigs - Duonebs - Bipap when necessary.  Home meds- Plavix- family doesn't know why she is on this medication. Gabapentin for  neuropathic pain. Meclizine for dizziness. - Hold Plavix, meclizine, gabapentin  DVT prophylaxis: Therapeutic heparin   Code Status: DNR- Confirmed with daughter and grand daughter.  Family Communication: Present at beside- daughter and grand-daughter. Disposition Plan: pending clinical course  Consults called: Cardiology  Admission status: Inpt, Step down.  Prognosis- poor, considering patient's significant metabolic acidosis, lactic  acidosis, ongoing sepsis, multiple organ dysfunction- cardiac, renal. Explained to family severity of patient's illness and poor prognosis. Expressed understanding, and coughing patient's wishes to be DO NOT RESUSCITATE   Procedures: none  Antimicrobials: (specify start and planned stop date. Auto populated tables are space occupying and do not give end dates)  Ceftriaxone 3/31   Subjective: Doing significantly much better today. The nasal cannula off BiPAP. Sitting up in bed, she wants to eat since she is hungry and her mouth is dry.  Objective: Vitals:   12/06/16 1600 12/06/16 1638 12/06/16 1700 12/06/16 1800  BP: 97/79  (!) 96/55 113/81  Pulse:   85 (!) 107  Resp: (!) 22  (!) 23 (!) 23  Temp:  97.8 F (36.6 C)    TempSrc:  Axillary    SpO2:    96%  Weight:      Height:        Intake/Output Summary (Last 24 hours) at 12/06/16 1842 Last data filed at 12/06/16 0645  Gross per 24 hour  Intake             1927 ml  Output              900 ml  Net             1027 ml   Filed Weights   2016/12/09 1508  Weight: 58.5 kg (128 lb 15.5 oz)    Examination:  General exam: Appears calm and comfortable  Respiratory system: Clear to auscultation. Respiratory effort normal. Cardiovascular system: S1 & S2 heard, RRR. No JVD, murmurs, rubs, gallops or clicks. No pedal edema. Gastrointestinal system: Abdomen is nondistended, soft and nontender. No organomegaly or masses felt. Normal bowel sounds heard. Central nervous system: Alert and oriented. No focal neurological deficits. Extremities: Symmetric 5 x 5 power. Skin: No rashes, lesions or ulcers Psychiatry: Judgement and insight appear normal. Mood & affect appropriate.   Data Reviewed: I have personally reviewed following labs and imaging studies  CBC:  Recent Labs Lab 12-09-16 0822 12/06/16 0424 12/06/16 0908  WBC 11.1* 10.1 10.1  NEUTROABS 6.1  --   --   HGB 16.2* 15.2* 14.5  HCT 50.6* 45.7 42.9  MCV 98.4 93.6 92.7  PLT  118* 59* 66*   Basic Metabolic Panel:  Recent Labs Lab 12-09-2016 0822 12/06/16 0424 12/06/16 0908  NA 137 141  --   K 3.7 3.2*  --   CL 104 110  --   CO2 9* 20*  --   GLUCOSE 367* 76  --   BUN 13 12  --   CREATININE 1.54* 0.85  --   CALCIUM 9.2 7.8*  --   MG  --   --  1.5*   Liver Function Tests:  Recent Labs Lab December 09, 2016 0822  AST 102*  ALT 15  ALKPHOS 73  BILITOT 0.8  PROT 6.5  ALBUMIN 3.4*    Recent Labs Lab 2016-12-09 0822  LIPASE 17    Recent Labs Lab 2016/12/09 0832  AMMONIA 34   Cardiac Enzymes:  Recent Labs Lab Dec 09, 2016 0822 12/09/16 1705 2016/12/09 2238  TROPONINI 0.25* 0.60* 0.47*  CBG:  Recent Labs Lab 12/06/16 0044 12/06/16 0406 12/06/16 0721 12/06/16 1121 12/06/16 1625  GLUCAP 115* 74 77 75 91   Sepsis Labs:  Recent Labs Lab 12/10/16 1151 12/10/16 1653 12/06/16 0908  LATICACIDVEN 8.9* 4.5* 2.3*    Recent Results (from the past 240 hour(s))  Culture, blood (routine x 2)     Status: None (Preliminary result)   Collection Time: 12-10-16  8:22 AM  Result Value Ref Range Status   Specimen Description RIGHT ANTECUBITAL  Final   Special Requests BOTTLES DRAWN AEROBIC AND ANAEROBIC 6CC EACH  Final   Culture NO GROWTH < 24 HOURS  Final   Report Status PENDING  Incomplete  Culture, blood (routine x 2)     Status: None (Preliminary result)   Collection Time: 12/10/16 12:17 PM  Result Value Ref Range Status   Specimen Description RIGHT ANTECUBITAL  Final   Special Requests BOTTLES DRAWN AEROBIC AND ANAEROBIC 6CC EACH  Final   Culture NO GROWTH < 24 HOURS  Final   Report Status PENDING  Incomplete  MRSA PCR Screening     Status: None   Collection Time: Dec 10, 2016 12:54 PM  Result Value Ref Range Status   MRSA by PCR NEGATIVE NEGATIVE Final    Comment:        The GeneXpert MRSA Assay (FDA approved for NASAL specimens only), is one component of a comprehensive MRSA colonization surveillance program. It is not intended to diagnose  MRSA infection nor to guide or monitor treatment for MRSA infections.   Urine culture     Status: Abnormal (Preliminary result)   Collection Time: 12/10/16  3:30 PM  Result Value Ref Range Status   Specimen Description URINE, CATHETERIZED  Final   Special Requests NONE  Final   Culture >=100,000 COLONIES/mL GRAM NEGATIVE RODS (A)  Final   Report Status PENDING  Incomplete    Radiology Studies: Ct Head Wo Contrast  Result Date: 12-10-16 CLINICAL DATA:  Acute mental status change. EXAM: CT HEAD WITHOUT CONTRAST TECHNIQUE: Contiguous axial images were obtained from the base of the skull through the vertex without intravenous contrast. COMPARISON:  October 03, 2015 FINDINGS: Brain: No subdural, epidural, or subarachnoid hemorrhage. Cerebellum and basal cisterns are normal. Low attenuation in the pons is likely artifactual. Volume loss with prominence of the sulci is stable. The ventricles are unchanged. No mass, mass effect, or midline shift. Scattered white matter changes. No acute cortical ischemia or infarct. Vascular: No hyperdense vessel or unexpected calcification. Skull: Normal. Negative for fracture or focal lesion. Sinuses/Orbits: No acute finding. Other: None. IMPRESSION: Chronic white matter changes. No acute cortical ischemia or infarct. No bleed identified. Electronically Signed   By: Gerome Sam III M.D   On: 12-10-16 11:15   Dg Chest Port 1 View  Result Date: 2016/12/10 CLINICAL DATA:  Altered mental status, combative EXAM: PORTABLE CHEST 1 VIEW COMPARISON:  10/03/2015 FINDINGS: The lungs are hyperinflated likely secondary to COPD. There is no focal parenchymal opacity. There is no pleural effusion or pneumothorax. There is stable cardiomegaly. There is mild bilateral interstitial prominence unchanged in the prior exam. The osseous structures are unremarkable. IMPRESSION: No active disease. Electronically Signed   By: Elige Ko   On: 12-10-2016 08:57   Scheduled Meds: .  cefTRIAXone (ROCEPHIN)  IV  2 g Intravenous Q24H  . insulin aspart  0-9 Units Subcutaneous Q4H  . ipratropium-albuterol  3 mL Nebulization Q6H  . magnesium sulfate 1 - 4 g bolus IVPB  2 g Intravenous  Once  . nicotine  14 mg Transdermal Daily  . nystatin   Topical BID  . potassium chloride  40 mEq Oral Q4H  . sodium bicarbonate  50 mEq Intravenous Once   Continuous Infusions: . dextrose 5 % and 0.45 % NaCl with KCl 40 mEq/L 75 mL/hr at 12/06/16 0831  . diltiazem (CARDIZEM) infusion 15 mg/hr (12/06/16 1734)     LOS: 1 day   Onnie Boer, MD [facility] Pager [phone]  If 7PM-7AM, please contact night-coverage [email or web address] Password [facility] 12/06/2016, 6:42 PM

## 2016-12-06 NOTE — Progress Notes (Signed)
Redraw of lactic acid was 3.2 On call midlevel notified via text page at 1053pm.

## 2016-12-06 DEATH — deceased

## 2016-12-07 ENCOUNTER — Inpatient Hospital Stay (HOSPITAL_COMMUNITY): Payer: Medicare Other

## 2016-12-07 ENCOUNTER — Encounter (HOSPITAL_COMMUNITY): Payer: Self-pay | Admitting: Adult Health

## 2016-12-07 DIAGNOSIS — Z7189 Other specified counseling: Secondary | ICD-10-CM

## 2016-12-07 DIAGNOSIS — I214 Non-ST elevation (NSTEMI) myocardial infarction: Secondary | ICD-10-CM

## 2016-12-07 DIAGNOSIS — Z515 Encounter for palliative care: Secondary | ICD-10-CM

## 2016-12-07 LAB — BLOOD GAS, ARTERIAL
ACID-BASE DEFICIT: 1.7 mmol/L (ref 0.0–2.0)
Bicarbonate: 23.4 mmol/L (ref 20.0–28.0)
DELIVERY SYSTEMS: POSITIVE
Drawn by: 277331
EXPIRATORY PAP: 6
FIO2: 0.5
INSPIRATORY PAP: 14
O2 Saturation: 87.7 %
PATIENT TEMPERATURE: 37
PH ART: 7.45 (ref 7.350–7.450)
RATE: 8 resp/min
pCO2 arterial: 31.7 mmHg — ABNORMAL LOW (ref 32.0–48.0)
pO2, Arterial: 55 mmHg — ABNORMAL LOW (ref 83.0–108.0)

## 2016-12-07 LAB — BASIC METABOLIC PANEL
Anion gap: 8 (ref 5–15)
BUN: 12 mg/dL (ref 6–20)
CHLORIDE: 104 mmol/L (ref 101–111)
CO2: 24 mmol/L (ref 22–32)
Calcium: 7.9 mg/dL — ABNORMAL LOW (ref 8.9–10.3)
Creatinine, Ser: 0.93 mg/dL (ref 0.44–1.00)
GFR calc Af Amer: >60 mL/min (ref >60)
GFR calc non Af Amer: 57 mL/min — ABNORMAL LOW (ref >60)
GLUCOSE: 154 mg/dL — AB (ref 65–99)
POTASSIUM: 3.3 mmol/L — AB (ref 3.5–5.1)
Sodium: 136 mmol/L (ref 135–145)

## 2016-12-07 LAB — URINE CULTURE: Culture: >=100000 — AB

## 2016-12-07 LAB — CBC
HEMATOCRIT: 40.6 % (ref 36.0–46.0)
HEMOGLOBIN: 13.7 g/dL (ref 12.0–15.0)
MCH: 31.4 pg (ref 26.0–34.0)
MCHC: 33.7 g/dL (ref 30.0–36.0)
MCV: 92.9 fL (ref 78.0–100.0)
Platelets: 57 10*3/uL — ABNORMAL LOW (ref 150–400)
RBC: 4.37 MIL/uL (ref 3.87–5.11)
RDW: 15.6 % — ABNORMAL HIGH (ref 11.5–15.5)
WBC: 9.4 10*3/uL (ref 4.0–10.5)

## 2016-12-07 LAB — GLUCOSE, CAPILLARY
GLUCOSE-CAPILLARY: 142 mg/dL — AB (ref 65–99)
GLUCOSE-CAPILLARY: 188 mg/dL — AB (ref 65–99)
GLUCOSE-CAPILLARY: 132 mg/dL — AB (ref 65–99)
Glucose-Capillary: 100 mg/dL — ABNORMAL HIGH (ref 65–99)

## 2016-12-07 LAB — LACTIC ACID, PLASMA: Lactic Acid, Venous: 2.9 mmol/L (ref 0.5–1.9)

## 2016-12-07 LAB — MAGNESIUM: Magnesium: 2.1 mg/dL (ref 1.7–2.4)

## 2016-12-07 MED ORDER — POTASSIUM CHLORIDE IN NACL 40-0.9 MEQ/L-% IV SOLN
INTRAVENOUS | Status: DC
  Administered 2016-12-07: 100 mL/h via INTRAVENOUS

## 2016-12-07 MED ORDER — MORPHINE SULFATE (PF) 2 MG/ML IV SOLN
2.0000 mg | INTRAVENOUS | Status: DC | PRN
  Administered 2016-12-07 – 2016-12-08 (×9): 2 mg via INTRAVENOUS
  Filled 2016-12-07 (×9): qty 1

## 2016-12-07 MED ORDER — POTASSIUM CHLORIDE 20 MEQ/15ML (10%) PO SOLN: 40.0000 meq | Freq: Once | ORAL | Status: DC

## 2016-12-07 MED ORDER — SODIUM CHLORIDE 0.9 % IV SOLN
1.0000 g | Freq: Two times a day (BID) | INTRAVENOUS | Status: DC
  Administered 2016-12-07: 1 g via INTRAVENOUS
  Filled 2016-12-07 (×4): qty 1

## 2016-12-07 MED ORDER — MORPHINE SULFATE (PF) 2 MG/ML IV SOLN: 2.0000 mg | INTRAVENOUS | Status: DC | PRN

## 2016-12-07 MED ORDER — MORPHINE SULFATE (PF) 2 MG/ML IV SOLN
2.0000 mg | INTRAVENOUS | Status: DC | PRN
  Administered 2016-12-07 (×2): 2 mg via INTRAVENOUS
  Filled 2016-12-07 (×2): qty 1

## 2016-12-07 MED ORDER — IPRATROPIUM-ALBUTEROL 0.5-2.5 (3) MG/3ML IN SOLN: 3.0000 mL | Freq: Four times a day (QID) | RESPIRATORY_TRACT | Status: DC | PRN

## 2016-12-07 MED ORDER — DEXTROSE 5 % IV SOLN
500.0000 mg | INTRAVENOUS | Status: DC
  Administered 2016-12-07: 500 mg via INTRAVENOUS
  Filled 2016-12-07 (×2): qty 500

## 2016-12-07 NOTE — Progress Notes (Addendum)
PROGRESS NOTE    Melanie Fields  JXB:[id number] DOB: 09-06-1934 DOA: 01/02/2017 PCP: Pearson Grippe, MD   Brief Narrative: Melanie Fields is a 83 y.o. female with medical history significant of COPD on 2L home O2, HTN, DM, Liver cirrhosis-?etiology. History was obtained from daughter and granddaughter, patient is on BiPAP, and then to questions but awake, restless. Per family- patient's workup at about 6 AM this morning to use the Bathroom, is on the commode for about 90 minutes, without taking her oxygen along. Daughter checked on patient and now found her to be awake but not responding and could not move. Patient is on 2 L of oxygen, she on many occasions will not use O2.  Over the past week family reports patient has been seen it is her time to go, she has been progressively week over the past 2 weeks, daughter notes chills this morning, both denies fevers, dysuria or frequency. Patient shortness of breath, and nonproductive cough are stable.  Patient still smokes cigs, family denies alcohol history.   ED Course: In the ED patient's heart rate was in the 130s, in A. fib with RVR, ABG with pH of 7.06, PCO2- below reportable range, Po2- 38, troponin 0.25, CMP- CO2 9, AG-24, normal ammonia levels-34, negative UDS, UA- positive for many bacteria and nitrites. Head CT and chest x-ray negative for acute abnormality.  Assessment & Plan:   Principal Problem:   Sepsis (HCC) Active Problems:   NSTEMI (non-ST elevated myocardial infarction) (HCC)   UTI (urinary tract infection)   Atrial fibrillation with rapid ventricular response (HCC)   Metabolic acidosis   COPD (chronic obstructive pulmonary disease) (HCC)   AKI (acute kidney injury) (HCC)   Hypoxemia   Goals of care, counseling/discussion   Palliative care encounter  Sepsis from UTI- patient lethargic today, Urine culture >100 000 GNR.  significant lactic acidosis- 8.9. ABG- PH- 7.0.   - IV ceftriaxone changed to meropenem - Patient's family later  elected to have comfort care, no more blood draws. - palliative consulted, recs appreciated, comfort care orders - BiPAP discontinued, [place]  Afib with RVR-  IV Cardizem, neutral but cytopenia hence heparin discontinued.  Acute on chronic respiratory failure- secondary to sepsis and COPD.on 2l of O2 at home.  - BIPAP PRN - O2 sats >92%  Anion gap Metabolic acidosis- resolved, bicarbonate-9, anion gap 24, CBG 367, respiratory compensation PCO2- below reportable range. Likely due to significant lactic acidosis, possibly AKI contributing -  IV hydration  NSTEMI- likely due to demand ischemia- Sepsis, Acidosis, Afib with RVR.  echo were no regional wall motion abnormality. - cards recs appreciated   Acute kidney injury- resolved, back to baseline. IV fluids  COPD- on 2 L at home. Stable . Requiring BiPAP here- initially, now nasal cannula. Family elects for comfort care. - Duonebs  Home meds- Plavix- family doesn't know why she is on this medication. Gabapentin for neuropathic pain. Meclizine for dizziness. - Hold Plavix, meclizine, gabapentin  DVT prophylaxis: SCDs   Code Status: DNR- Confirmed with daughter and grand daughter.  Family Communication: Present at beside- daughter and grand-daughter. Disposition Plan: pending clinical course  Consults called: Cardiology, palliative  Admission status: Inpt, Step down.  Prognosis- poor, considering patient's significant metabolic acidosis, lactic acidosis, ongoing sepsis, multiple organ dysfunction- cardiac, renal. Explained to family severity of patient's illness and poor prognosis. Expressed understanding, and coughing patient's wishes to be DO NOT RESUSCITATE. 4/2- they elect for comfort care.   Procedures: none  Antimicrobials: (  specify start and planned stop date. Auto populated tables are space occupying and do not give end dates)  Ceftriaxone 3/31 >> 4/2  Meropenem 4/2 >> 4/2  Subjective: Responding to questions.  Requiring BiPAP. Talked to patient's daughter at bedside. BiPAP discontinued  Objective: Vitals:   12/07/16 0919 12/07/16 1100 12/07/16 1347 12/07/16 1653  BP:   (!) 90/57   Pulse: 96  97   Resp: (!) 21  (!) 21   Temp:  98.4 F (36.9 C)    TempSrc:  Oral    SpO2: 92%  96% (!) 86%  Weight:      Height:        Intake/Output Summary (Last 24 hours) at 12/07/16 1824 Last data filed at 12/07/16 1500  Gross per 24 hour  Intake          3246.33 ml  Output              850 ml  Net          2396.33 ml   Filed Weights   11/30/2016 1508  Weight: 58.5 kg (128 lb 15.5 oz)    Examination:  General exam: Initially on BiPAP  Respiratory system: Clear to auscultation. Respiratory effort normal. Cardiovascular system: S1 & S2 heard, irregular,  No JVD, murmurs, rubs, gallops or clicks. No pedal edema. Gastrointestinal system: Abdomen is nondistended, soft and nontender. No organomegaly or masses felt.  Central nervous system: Alert and oriented. No focal neurological deficits. Extremities: Symmetric 5 x 5 power. Skin: No rashes, lesions or ulcers Psychiatry: Judgement and insight appear normal. Mood & affect appropriate.   Data Reviewed: I have personally reviewed following labs and imaging studies  CBC:  Recent Labs Lab 11/27/2016 0822 12/06/16 0424 12/06/16 0908 12/07/16 0951  WBC 11.1* 10.1 10.1 9.4  NEUTROABS 6.1  --   --   --   HGB 16.2* 15.2* 14.5 13.7  HCT 50.6* 45.7 42.9 40.6  MCV 98.4 93.6 92.7 92.9  PLT 118* 59* 66* 57*   Basic Metabolic Panel:  Recent Labs Lab 12/04/2016 0822 12/06/16 0424 12/06/16 0908 12/07/16 0429  NA 137 141  --  136  K 3.7 3.2*  --  3.3*  CL 104 110  --  104  CO2 9* 20*  --  24  GLUCOSE 367* 76  --  154*  BUN 13 12  --  12  CREATININE 1.54* 0.85  --  0.93  CALCIUM 9.2 7.8*  --  7.9*  MG  --   --  1.5* 2.1   Liver Function Tests:  Recent Labs Lab 11/10/2016 0822  AST 102*  ALT 15  ALKPHOS 73  BILITOT 0.8  PROT 6.5  ALBUMIN 3.4*     Recent Labs Lab 11/15/2016 0822  LIPASE 17    Recent Labs Lab 12/01/2016 0832  AMMONIA 34   Cardiac Enzymes:  Recent Labs Lab 12/02/2016 0822 12/03/2016 1705 12/04/2016 2238  TROPONINI 0.25* 0.60* 0.47*  CBG:  Recent Labs Lab 12/06/16 1937 12/07/16 0007 12/07/16 0314 12/07/16 0732 12/07/16 1110  GLUCAP 95 100* 142* 188* 132*   Sepsis Labs:  Recent Labs Lab 12/06/16 0908 12/06/16 1958 12/06/16 2156 12/07/16 0951  LATICACIDVEN 2.3* 6.5* 3.2* 2.9*    Recent Results (from the past 240 hour(s))  Culture, blood (routine x 2)     Status: None (Preliminary result)   Collection Time: 11/16/2016  8:22 AM  Result Value Ref Range Status   Specimen Description RIGHT ANTECUBITAL  Final  Special Requests BOTTLES DRAWN AEROBIC AND ANAEROBIC 6CC EACH  Final   Culture NO GROWTH 2 DAYS  Final   Report Status PENDING  Incomplete  Culture, blood (routine x 2)     Status: None (Preliminary result)   Collection Time: December 17, 2016 12:17 PM  Result Value Ref Range Status   Specimen Description RIGHT ANTECUBITAL  Final   Special Requests BOTTLES DRAWN AEROBIC AND ANAEROBIC 6CC EACH  Final   Culture NO GROWTH 2 DAYS  Final   Report Status PENDING  Incomplete  MRSA PCR Screening     Status: None   Collection Time: 12/17/16 12:54 PM  Result Value Ref Range Status   MRSA by PCR NEGATIVE NEGATIVE Final    Comment:        The GeneXpert MRSA Assay (FDA approved for NASAL specimens only), is one component of a comprehensive MRSA colonization surveillance program. It is not intended to diagnose MRSA infection nor to guide or monitor treatment for MRSA infections.   Urine culture     Status: Abnormal   Collection Time: 12-17-16  3:30 PM  Result Value Ref Range Status   Specimen Description URINE, CATHETERIZED  Final   Special Requests NONE  Final   Culture >=100,000 COLONIES/mL ESCHERICHIA COLI (A)  Final   Report Status 12/07/2016 FINAL  Final   Organism ID, Bacteria ESCHERICHIA  COLI (A)  Final      Susceptibility   Escherichia coli - MIC*    AMPICILLIN 4 SENSITIVE Sensitive     CEFAZOLIN <=4 SENSITIVE Sensitive     CEFTRIAXONE <=1 SENSITIVE Sensitive     CIPROFLOXACIN >=4 RESISTANT Resistant     GENTAMICIN <=1 SENSITIVE Sensitive     IMIPENEM <=0.25 SENSITIVE Sensitive     NITROFURANTOIN <=16 SENSITIVE Sensitive     TRIMETH/SULFA <=20 SENSITIVE Sensitive     AMPICILLIN/SULBACTAM <=2 SENSITIVE Sensitive     PIP/TAZO <=4 SENSITIVE Sensitive     Extended ESBL NEGATIVE Sensitive     * >=100,000 COLONIES/mL ESCHERICHIA COLI    Radiology Studies: Dg Chest Port 1 View  Result Date: 12/07/2016 CLINICAL DATA:  Short of breath EXAM: PORTABLE CHEST 1 VIEW COMPARISON:  2016-12-17 FINDINGS: Progression of bibasilar airspace disease and small effusions. Negative for pulmonary edema. IMPRESSION: Progression of bibasilar atelectasis/ infiltrate and bilateral effusions. Electronically Signed   By: Marlan Palau M.D.   On: 12/07/2016 08:55   Scheduled Meds: . nicotine  14 mg Transdermal Daily  . nystatin   Topical BID  . potassium chloride  40 mEq Oral Once  . sodium bicarbonate  50 mEq Intravenous Once   Continuous Infusions: . 0.9 % NaCl with KCl 40 mEq / L Stopped (12/07/16 1506)  . diltiazem (CARDIZEM) infusion Stopped (12/07/16 1506)     LOS: 2 days   Onnie Boer, MD [facility] Pager [phone]  If 7PM-7AM, please contact night-coverage [email or web address] Password [facility] 12/07/2016, 6:24 PM

## 2016-12-07 NOTE — Progress Notes (Signed)
Dr. Mariea Clonts notified of patient critical lactic acid.

## 2016-12-07 NOTE — Progress Notes (Signed)
Pharmacy Antibiotic Note  Melanie Fields is a 83 y.o. female admitted on 12/22/2016 with UTI.  Pharmacy has been consulted for MEROPENEM dosing.  Plan: Meropenem 1gm IV q12hrs (age, renally adjusted) Monitor labs, progress, c/s  Height:  (157.5 cm) Weight: 128 lb 15.5 oz (58.5 kg) IBW/kg (Calculated) : 50.1  Temp (24hrs), Avg:98.1 F (36.7 C), Min:97.6 F (36.4 C), Max:98.6 F (37 C)   Recent Labs Lab 12-22-2016 0822 22-Dec-2016 1151 2016-12-22 1653 12/06/16 0424 12/06/16 0908 12/06/16 1958 12/06/16 2156 12/07/16 0429  WBC 11.1*  --   --  10.1 10.1  --   --   --   CREATININE 1.54*  --   --  0.85  --   --   --  0.93  LATICACIDVEN  --  8.9* 4.5*  --  2.3* 6.5* 3.2*  --     Estimated Creatinine Clearance: 38.2 mL/min (by C-G formula based on SCr of 0.93 mg/dL).    Allergies  Allergen Reactions  . Sulfonamide Derivatives Itching  . Penicillins Rash and Other (See Comments)    Has patient had a PCN reaction causing immediate rash, facial/tongue/throat swelling, SOB or lightheadedness with hypotension: No Has patient had a PCN reaction causing severe rash involving mucus membranes or skin necrosis: No Has patient had a PCN reaction that required hospitalization No Has patient had a PCN reaction occurring within the last 10 years: No If all of the above answers are "NO", then may proceed with Cephalosporin use.    Antimicrobials this admission: Meropenem 4/2 >>  Rocephin 4/1 x 1  Dose adjustments this admission:  Microbiology results:  3/31 BCx: pending  3/31 UCx: EColi   Sputum:    MRSA PCR: negative  Thank you for allowing pharmacy to be a part of this patient's care.  Melanie Fields A 12/07/2016 9:39 AM

## 2016-12-07 NOTE — Progress Notes (Signed)
Patient O2 saturations were in the 60s with a good wave form, daughter was in this morning and stated that she believed patient looked much worse than yesterday. Chest xray was done and Dr. Mariea Clonts was in to see patient and ordered ABG. Placed patient back on bipap during this time and her O2 saturations came up to 90% fairly quickly. Will continue to closely monitor, daughter at bedside.

## 2016-12-07 NOTE — Progress Notes (Signed)
SLP Cancellation Note  Patient Details Name: Melanie Fields MRN: [id number] DOB: [birth year]   Cancelled treatment:       Reason Eval/Treat Not Completed: Patient not medically ready;Other (comment) (Family has elected for comfort care); RN advised SLP that services not needed at this time. Pt is on Bi-Pap and will be transitioning to comfort care soon. SLP will sign off. Reconsult if needed.  Thank you,  Havery Moros, CCC-SLP [phone]    Davia Smyre 12/07/2016, 2:14 PM

## 2016-12-07 NOTE — Consult Note (Signed)
Consultation Note Date: 12/07/2016   Patient Name: Melanie Fields  DOB: November 21, 1933  MRN: [id number]  Age / Sex: 83 y.o., female  PCP: Pearson Grippe, MD Referring Physician: Onnie Boer, MD  Reason for Consultation: Establishing goals of care, Psychosocial/spiritual support and Withdrawal of life-sustaining treatment  HPI/Patient Profile: 83 y.o. female  with past medical history of Back pain, diabetes, hypertension, COPD with, oxygen use, liver cirrhosis, admitted on 11/09/2016 with sepsis from UTI, a fibwith RVR.   Clinical Assessment and Goals of Care: Melanie Fields is lying quietly in bed. She is surrounded by her family including, daughter Melanie Fields, granddaughter Melanie Fields, Niece Melanie Fields, and grandson Melanie Fields. Melanie Fields is unable to communicate her basic needs, she has BiPAP in place. Family states that she has had a functional decline over the past few weeks, that her life has been "rough". They state that she has not been eating well for the last month, food is been getting "stuck". They share that Melanie Fields has lived with her daughter, Melanie Fields for the past 18 years after her husband passed away.  Family shares Melanie Fields's desires. They state "we know she is ready". Granddaughter Melanie Fields states that Melanie Fields grabbed her face this morning and told her clearly that she was ready to pass away. We talk about unburdening Melanie Fields from treatments that aren't changing outcomes for her, focusing on comfort and dignity. Family states that they have loved ones who are on the way, but request that we unburden Melanie Fields from the BiPAP. I share that she may pass quickly. Family states that they are aware of and accepting of this, as long as she does not suffer.  Conference with nursing staff who state they feel comfortable providing end-of-life care for Melanie Fields.  Healthcare power of attorney HCPOA - only  surviving daughter, Melanie Fields.   SUMMARY OF RECOMMENDATIONS   full comfort measures, remove BiPAP, let nature take its course.  Code Status/Advance Care Planning:  DNR  Symptom Management:   increased morphine 2 mg Q 30 minutes PRN  Palliative Prophylaxis:   Frequent Pain Assessment and Turn Reposition  Additional Recommendations (Limitations, Scope, Preferences):  Full Comfort Care  Psycho-social/Spiritual:   Desire for further Chaplaincy support:no  Additional Recommendations: Compassionate Wean Education and ICU Family Guide  Prognosis:   Hours - Days  Discharge Planning: Anticipated Hospital Death      Primary Diagnoses: Present on Admission: . Sepsis (HCC) . NSTEMI (non-ST elevated myocardial infarction) (HCC) . UTI (urinary tract infection) . Atrial fibrillation with rapid ventricular response (HCC) . Metabolic acidosis . COPD (chronic obstructive pulmonary disease) (HCC) . AKI (acute kidney injury) (HCC) . Hypoxemia   I have reviewed the medical record, interviewed the patient and family, and examined the patient. The following aspects are pertinent.  Past Medical History:  Diagnosis Date  . Back pain   . Cirrhosis (HCC)   . Diabetes mellitus   . Hypertension    Social History   Social History  . Marital status: Widowed  Spouse name: N/A  . Number of children: N/A  . Years of education: N/A   Social History Main Topics  . Smoking status: Current Every Day Smoker    Packs/day: 0.50    Types: Cigarettes  . Smokeless tobacco: Current User  . Alcohol use No  . Drug use: No  . Sexual activity: Not Asked   Other Topics Concern  . None   Social History Narrative  . None   Family History  Problem Relation Age of Onset  . Brain cancer Mother   . Heart attack Father    Scheduled Meds: . ipratropium-albuterol  3 mL Nebulization Q6H  . nicotine  14 mg Transdermal Daily  . nystatin   Topical BID  . potassium chloride  40 mEq Oral  Once  . sodium bicarbonate  50 mEq Intravenous Once   Continuous Infusions: . 0.9 % NaCl with KCl 40 mEq / L Stopped (12/07/16 1506)  . diltiazem (CARDIZEM) infusion Stopped (12/07/16 1506)   PRN Meds:.morphine injection, ondansetron **OR** ondansetron (ZOFRAN) IV Medications Prior to Admission:  Prior to Admission medications   Medication Sig Start Date End Date Taking? Authorizing Provider  clopidogrel (PLAVIX) 75 MG tablet Take 75 mg by mouth daily.     Yes Historical Provider, MD  gabapentin (NEURONTIN) 100 MG capsule Take 100 mg by mouth 3 (three) times daily.   Yes Historical Provider, MD  glipiZIDE (GLUCOTROL) 10 MG tablet Take 10 mg by mouth 2 (two) times daily before a meal.    Yes Historical Provider, MD   Allergies  Allergen Reactions  . Sulfonamide Derivatives Itching  . Penicillins Rash and Other (See Comments)    Has patient had a PCN reaction causing immediate rash, facial/tongue/throat swelling, SOB or lightheadedness with hypotension: No Has patient had a PCN reaction causing severe rash involving mucus membranes or skin necrosis: No Has patient had a PCN reaction that required hospitalization No Has patient had a PCN reaction occurring within the last 10 years: No If all of the above answers are "NO", then may proceed with Cephalosporin use.    Review of Systems  Unable to perform ROS: Acuity of condition    Physical Exam  Constitutional: No distress.  Appears acutely ill  HENT:  Head: Normocephalic and atraumatic.  Cardiovascular: Normal rate.   Pulmonary/Chest:  BiPAP  Abdominal: Soft. She exhibits no distension.  Musculoskeletal: She exhibits no edema.  Neurological:  Lethargic, does not open eyes to touch or command  Skin: Skin is warm and dry.  Nursing note and vitals reviewed.   Vital Signs: BP (!) 90/57   Pulse 97   Temp 98.4 F (36.9 C) (Oral)   Resp (!) 21   Ht  (1.575 m)   Wt 58.5 kg (128 lb 15.5 oz)   SpO2 96%   BMI 23.59 kg/m    Pain Assessment: No/denies pain   Pain Score: Asleep   SpO2: SpO2: 96 % O2 Device:SpO2: 96 % O2 Flow Rate: .O2 Flow Rate (L/min): 10 L/min  IO: Intake/output summary:  Intake/Output Summary (Last 24 hours) at 12/07/16 1623 Last data filed at 12/07/16 1500  Gross per 24 hour  Intake          3246.33 ml  Output              850 ml  Net          2396.33 ml    LBM:   Baseline Weight: Weight: 58.5 kg (128 lb 15.5 oz)  Most recent weight: Weight: 58.5 kg (128 lb 15.5 oz)     Palliative Assessment/Data:   Flowsheet Rows     Most Recent Value  Intake Tab  Referral Department  Hospitalist  Unit at Time of Referral  ICU  Palliative Care Primary Diagnosis  Pulmonary  Date Notified  12/07/16  Palliative Care Type  New Palliative care  Reason for referral  Clarify Goals of Care, End of Life Care Assistance  Date of Admission  12-19-2016  Date first seen by Palliative Care  12/07/16  # of days Palliative referral response time  0 Day(s)  # of days IP prior to Palliative referral  2  Clinical Assessment  Palliative Performance Scale Score  10%  Pain Max last 24 hours  Not able to report  Pain Min Last 24 hours  Not able to report  Dyspnea Max Last 24 Hours  Not able to report  Dyspnea Min Last 24 hours  Not able to report  Psychosocial & Spiritual Assessment  Palliative Care Outcomes  Patient/Family meeting held?  Yes  Who was at the meeting?  Daughter, Melanie Fields, granddaughter Melanie Fields, Niece Melanie Fields, grandson Melanie Fields  Palliative Care Outcomes  Changed to focus on comfort, Provided psychosocial or spiritual support, Provided end of life care assistance, Clarified goals of care  Patient/Family wishes: Interventions discontinued/not started   Mechanical Ventilation, BiPAP      Time In: 1400 Time Out: 1430 Time Total: 30 minutes Greater than 50%  of this time was spent counseling and coordinating care related to the above assessment and plan.  Signed by: Katheran Awe, NP    Please contact Palliative Medicine Team phone at [phone] for questions and concerns.  For individual provider: See Loretha Stapler

## 2016-12-07 NOTE — Progress Notes (Signed)
Family approached Jolly Mango, RN about wanting palliative care for patient while I was in another patient's room. Palliative care consult is in at this time. The daughter stated that she does not want the patient stuck anymore for blood draws or IVs. Dr. Mariea Clonts paged as well.

## 2016-12-07 NOTE — Progress Notes (Signed)
**Note De-Identified  Obfuscation** Patient removed from BIPAP and placed on 3L [place] per comfort care.  RRT to continue to monitor.

## 2016-12-07 NOTE — Progress Notes (Signed)
Patient is currently still on bipap until the rest of her family arrives. Daughter will let me know when they are ready to remove bipap and place patient on [place].

## 2016-12-07 NOTE — Consult Note (Signed)
CARDIOLOGY CONSULT NOTE   Patient ID: Melanie Fields MRN: [id number] DOB/AGE: [birth year] 83 y.o.  Admit Date: 12/03/2016 Referring Physician: Rhys Martini MD Primary Physician: Pearson Grippe, MD Consulting Cardiologist: Charlton Haws MD Primary Cardiologist: New Reason for Consultation: New onset Atrial fib with RVR   Clinical Summary  Melanie Fields is a 83 y.o. female who is being seen today for the evaluation of new onset atrial fib with RVR at the request of Dr.Emokpae. She has a history of oxygen dependent COPD, hypertension, Type II diabetes, and cirrhosis of the liver. She was admitted with progressive weakness, dyspnea, chills  She was brought to ER after being found by daughter sitting on the commode and non-responsive.  On arrival to ER was found to be in Atrial fib with RVR. She has been diagnosed with sepsis UTI.   Labs in ER revealed elevated troponin of 0.25, with subsequent troponin of 0.60; 0.47 respectively.  Other pertinent labs: Blood glucose was elevated at 367, lactic acid 8.9. ABG revealed respiratory acidosis. CBC with leukocytosis, 11.1, no evidence of anemia. CXR did not reveal any CHF or pneumonia. No evidence of CVA per head CT. EKG revealed atrial fib with RBBB, rate of 129 bpm. Atrial fib is new compared to EKG 5./2017, RBBB has been present.   She was treated with IV hydration, duoneb treatment, IV antibiotics. EKG this am reveals RBBB with atrial fib rate better controlled at 96 bpm. She is now on diltiazem gtt at 15 mg hour.She is not responsive except to tactile stimulation, wearing BIPAP. Hx is obtained from current and PMH.   Allergies  Allergen Reactions  . Sulfonamide Derivatives Itching  . Penicillins Rash and Other (See Comments)    Has patient had a PCN reaction causing immediate rash, facial/tongue/throat swelling, SOB or lightheadedness with hypotension: No Has patient had a PCN reaction causing severe rash involving mucus membranes or skin necrosis:  No Has patient had a PCN reaction that required hospitalization No Has patient had a PCN reaction occurring within the last 10 years: No If all of the above answers are "NO", then may proceed with Cephalosporin use.     Medications Scheduled Medications: . azithromycin  500 mg Intravenous Q24H  . insulin aspart  0-9 Units Subcutaneous Q4H  . ipratropium-albuterol  3 mL Nebulization Q6H  . meropenem (MERREM) IV  1 g Intravenous Q12H  . nicotine  14 mg Transdermal Daily  . nystatin   Topical BID  . potassium chloride  40 mEq Oral Once  . sodium bicarbonate  50 mEq Intravenous Once    Infusions: . 0.9 % NaCl with KCl 40 mEq / L 100 mL/hr (12/07/16 0809)  . diltiazem (CARDIZEM) infusion 15 mg/hr (12/07/16 0633)    PRN Medications: morphine injection, ondansetron **OR** ondansetron (ZOFRAN) IV   Past Medical History:  Diagnosis Date  . Back pain   . Cirrhosis (HCC)   . Diabetes mellitus   . Hypertension     History reviewed. No pertinent surgical history.  Family History  Problem Relation Age of Onset  . Brain cancer Mother   . Heart attack Father      Social History Melanie Fields reports that she has been smoking Cigarettes.  She has been smoking about 0.50 packs per day. She uses smokeless tobacco. Melanie Fields reports that she does not drink alcohol.  Review of Systems Complete review of systems are found to be negative unless outlined in H&P above.  Physical Examination Blood pressure  107/70, pulse 96, temperature 98.4 F (36.9 C), temperature source Axillary, resp. rate (!) 21, height  (1.575 m), weight 128 lb 15.5 oz (58.5 kg), SpO2 92 %.  Intake/Output Summary (Last 24 hours) at 12/07/16 0940 Last data filed at 12/07/16 9604  Gross per 24 hour  Intake          2088.33 ml  Output              850 ml  Net          1238.33 ml    Telemetry: Personally reviewed. Atrial fib with RBBB. Rates 96-109  GEN: Ill appearing wearing BIPAP, confused. Moaning HEENT:  Conjunctiva and lids normal, oropharynx clear with moist mucosa. Neck: Supple, no elevated JVP or carotid bruits, no thyromegaly Lungs: Clear to auscultation, labored breathing at rest wearing BIPAP Cardiac: Irregular rate and rhythm, no S3 or significant systolic murmur, no pericardial rub. Abdomen: Soft, hypoactive to absent bowel sounds,  Extremities: No pitting edema, distal pulses 1+. Skin: Warm and dry. Musculoskeletal: No kyphosis. Neuropsychiatric:  Responsive to tactile stimulation, with moaning.  Prior Cardiac Testing/Procedures Echocardiogram 11/05/2016 Left ventricle: The cavity size was normal. There was moderate   concentric hypertrophy. Systolic function was vigorous. The   estimated ejection fraction was in the range of 65% to 70%. Wall   motion was normal; there were no regional wall motion   abnormalities. Doppler parameters are consistent with abnormal   left ventricular relaxation (grade 1 diastolic dysfunction). - Ventricular septum: The contour showed diastolic flattening. - Right ventricle: The cavity size was severely dilated. Wall   thickness was normal. Systolic function was moderately reduced.   However, lateral annulus peak S velocity: 12.6 cm/s. - Tricuspid valve: There was trivial regurgitation. Lab Results  Basic Metabolic Panel:  Recent Labs Lab 03-Jan-2017 0822 12/06/16 0424 12/06/16 0908 12/07/16 0429  NA 137 141  --  136  K 3.7 3.2*  --  3.3*  CL 104 110  --  104  CO2 9* 20*  --  24  GLUCOSE 367* 76  --  154*  BUN 13 12  --  12  CREATININE 1.54* 0.85  --  0.93  CALCIUM 9.2 7.8*  --  7.9*  MG  --   --  1.5* 2.1    Liver Function Tests:  Recent Labs Lab 03-Jan-2017 0822  AST 102*  ALT 15  ALKPHOS 73  BILITOT 0.8  PROT 6.5  ALBUMIN 3.4*    CBC:  Recent Labs Lab 01-03-2017 0822 12/06/16 0424 12/06/16 0908  WBC 11.1* 10.1 10.1  NEUTROABS 6.1  --   --   HGB 16.2* 15.2* 14.5  HCT 50.6* 45.7 42.9  MCV 98.4 93.6 92.7  PLT 118* 59*  66*    Cardiac Enzymes:  Recent Labs Lab 01-03-17 0822 01/03/17 1705 Jan 03, 2017 2238  TROPONINI 0.25* 0.60* 0.47*    Radiology: Ct Head Wo Contrast  Result Date: 2017-01-03 CLINICAL DATA:  Acute mental status change. EXAM: CT HEAD WITHOUT CONTRAST TECHNIQUE: Contiguous axial images were obtained from the base of the skull through the vertex without intravenous contrast. COMPARISON:  October 03, 2015 FINDINGS: Brain: No subdural, epidural, or subarachnoid hemorrhage. Cerebellum and basal cisterns are normal. Low attenuation in the pons is likely artifactual. Volume loss with prominence of the sulci is stable. The ventricles are unchanged. No mass, mass effect, or midline shift. Scattered white matter changes. No acute cortical ischemia or infarct. Vascular: No hyperdense vessel or unexpected calcification. Skull: Normal.  Negative for fracture or focal lesion. Sinuses/Orbits: No acute finding. Other: None. IMPRESSION: Chronic white matter changes. No acute cortical ischemia or infarct. No bleed identified. Electronically Signed   By: Gerome Sam III M.D   On: 2016/12/17 11:15   Dg Chest Port 1 View  Result Date: 12/07/2016 CLINICAL DATA:  Short of breath EXAM: PORTABLE CHEST 1 VIEW COMPARISON:  12/17/16 FINDINGS: Progression of bibasilar airspace disease and small effusions. Negative for pulmonary edema. IMPRESSION: Progression of bibasilar atelectasis/ infiltrate and bilateral effusions. Electronically Signed   By: Marlan Palau M.D.   On: 12/07/2016 08:55     ECG: Atrial fib with RBBB rate 96 bpm.    Impression and Recommendations 1. New onset atrial fib with RVR: Likely from UTI sepsis, COPD. She is now on IV diltiazem at 15 mg hour. BP is stable, with some improvement in HR. CHADS VASC Score of 4 (Age, Gender, Diabetes, HTN)/. Not currently on anticoagulation therapy due to thrombocytopenia.  PLTs 66.  Continue diltiazem gtt.   2. Demand ischemia: Elevated troponin in the setting  of sepsis and afib with RVR. No prior history of CAD. Echo did not reveal decreased LV fx.   3. UTI sepsis: On antibiotic therapy and IV hydration.   4. Diabetes: Elevated blood glucose on admission. Defer to PCP team.  5. O2 dependent COPD: Currently on BiPAP. Management per PCP team.   5. Hypoactive to absent bowel sounds: R/O obstruction impaction. Flat plate of abdomen to confirm.   Signed: Bettey Mare. Lawrence NP [facility]  12/07/2016, 9:40 AM Co-Sign MD  Patient examined chart reviewed. Fail elderly white female admitted with sepsis, encephalopathy and respiratory failure With COPD. Found to be in presumed new onset afib. Currently not a candidate for anticoagulation due to low PLTls Not responsive not really taking PO.  Continue iv cardizem for rate control Echo to assess EF and estimate PA pressures.  Lovenox DVT prophylaxis for now Has SEM on exam with bilateral rhonchi.  Continue hydration and antibiotics for SIRS. Minimally elevated troponin with no acute ECG changes no indication for aggressive w/u of CAD  Charlton Haws

## 2016-12-07 NOTE — Progress Notes (Signed)
Patient has BIPAP on standby at bedside. Patient on 10LHFNC with 02 saturations at 91%. RT will continue to monitor patient and place bipap back on if needed.

## 2016-12-07 NOTE — Progress Notes (Signed)
**Note De-identified  Obfuscation** ABG collected  

## 2016-12-08 DIAGNOSIS — Z515 Encounter for palliative care: Secondary | ICD-10-CM

## 2016-12-08 MED ORDER — ARTIFICIAL TEARS OP OINT
TOPICAL_OINTMENT | OPHTHALMIC | Status: DC | PRN
  Filled 2016-12-08: qty 3.5

## 2016-12-08 MED ORDER — SODIUM CHLORIDE 0.9 % IV SOLN
4.0000 mg/h | INTRAVENOUS | Status: DC
  Administered 2016-12-08: 4 mg/h via INTRAVENOUS
  Filled 2016-12-08: qty 10

## 2016-12-08 MED ORDER — SODIUM CHLORIDE 0.9 % IV SOLN
INTRAVENOUS | Status: DC
  Administered 2016-12-08: 11:00:00 via INTRAVENOUS

## 2016-12-10 DIAGNOSIS — J449 Chronic obstructive pulmonary disease, unspecified: Secondary | ICD-10-CM | POA: Diagnosis not present

## 2016-12-10 LAB — CULTURE, BLOOD (ROUTINE X 2)
CULTURE: NO GROWTH
Culture: NO GROWTH

## 2017-01-05 NOTE — Progress Notes (Signed)
LCSW is aware of consult and has reviewed chart. Per Palliative note: patient full comfort with expected hospital death.  Will defer placement at this time, but available if patient improves or changes in disposition occur.  Please re-consult as needed.  Melanie Fields, MSW Clinical Social Work: Optician, dispensing Coverage for :  [phone]

## 2017-01-05 NOTE — Plan of Care (Signed)
Problem: Education: Goal: Knowledge of the prescribed therapeutic regimen will improve Outcome: Progressing Family understands the patients medications for comfort care.

## 2017-01-05 NOTE — Progress Notes (Signed)
Progress Note  Patient Name: Melanie Fields Date of Encounter: 12/14/2016  Primary Cardiologist: Eden Emms  Subjective   Lethargic not verbal   Inpatient Medications    Scheduled Meds: . nicotine  14 mg Transdermal Daily  . nystatin   Topical BID  . potassium chloride  40 mEq Oral Once  . sodium bicarbonate  50 mEq Intravenous Once   Continuous Infusions: . 0.9 % NaCl with KCl 40 mEq / L Stopped (12/07/16 1506)  . diltiazem (CARDIZEM) infusion Stopped (12/07/16 1506)   PRN Meds: ipratropium-albuterol, morphine injection, ondansetron **OR** ondansetron (ZOFRAN) IV   Vital Signs    Vitals:   12/07/16 1653 12/07/16 1900 12/07/16 2200 12/16/2016 0500  BP:  91/73 (!) 76/51 (!) 85/51  Pulse:  (!) 31 (!) 32 (!) 29  Resp:    (!) 29  Temp:      TempSrc:      SpO2: (!) 86% (!) 71% (!) 77% (!) 72%  Weight:      Height:        Intake/Output Summary (Last 24 hours) at 12/27/2016 0759 Last data filed at 12/07/16 1500  Gross per 24 hour  Intake             1158 ml  Output                0 ml  Net             1158 ml   Filed Weights   12/07/16 1508  Weight: 128 lb 15.5 oz (58.5 kg)    Telemetry    Afib/flutter rates 90's  - Personally Reviewed  ECG    aifb RBBB rate 129  - Personally Reviewed  Physical Exam  Non verbal  GEN: No acute distress.   Neck: No JVD Cardiac: RRR, SEM murmurs, rubs, or gallops.  Respiratory: Clear to auscultation bilaterally. GI: Soft, nontender, non-distended  MS: No edema; No deformity. Neuro:  Nonfocal  Psych: Normal affect   Labs    Chemistry  Recent Labs Lab 12-07-16 0822 12/06/16 0424 12/07/16 0429  NA 137 141 136  K 3.7 3.2* 3.3*  CL 104 110 104  CO2 9* 20* 24  GLUCOSE 367* 76 154*  BUN CREATININE 1.54* 0.85 0.93  CALCIUM 9.2 7.8* 7.9*  PROT 6.5  --   --   ALBUMIN 3.4*  --   --   AST 102*  --   --   ALT 15  --   --   ALKPHOS 73  --   --   BILITOT 0.8  --   --   GFRNONAA 31* >60 57*  GFRAA 36* >60 >60    ANIONGAP 24* 11 8     Hematology  Recent Labs Lab 12/06/16 0424 12/06/16 0908 12/07/16 0951  WBC 10.1 10.1 9.4  RBC 4.88 4.63 4.37  HGB 15.2* 14.5 13.7  HCT 45.7 42.9 40.6  MCV 93.6 92.7 92.9  MCH 31.1 31.3 31.4  MCHC 33.3 33.8 33.7  RDW 15.5 15.3 15.6*  PLT 59* 66* 57*    Cardiac Enzymes  Recent Labs Lab 12/07/2016 0822 12-07-16 1705 December 07, 2016 2238  TROPONINI 0.25* 0.60* 0.47*   No results for input(s): TROPIPOC in the last 168 hours.   BNPNo results for input(s): BNP, PROBNP in the last 168 hours.   DDimer No results for input(s): DDIMER in the last 168 hours.   Radiology    Dg Chest [facility] 1 View  Result Date:  12/07/2016 CLINICAL DATA:  Short of breath EXAM: PORTABLE CHEST 1 VIEW COMPARISON:  11/26/2016 FINDINGS: Progression of bibasilar airspace disease and small effusions. Negative for pulmonary edema. IMPRESSION: Progression of bibasilar atelectasis/ infiltrate and bilateral effusions. Electronically Signed   By: Marlan Palau M.D.   On: 12/07/2016 08:55    Cardiac Studies   Echo personally reviewed EF 65-70% RV dilated   Patient Profile     83 y.o. female admitted with sepsis oxygen dependant COPD and cirrhosis Had Rapid atrial fibrillation   Assessment & Plan    Afib:  cardizem stopped rates ok no anticoagulation due to thrombocytopenia EF normal by echo  Elevated troponin : no evidence of acute cardiac event no RWMA;s on echo  Sepsis:  Lactate still elevated antibiotics stopped DNR/ Comfort care per palliative consult  Will sign off   Signed, Charlton Haws, MD  2016/12/17, 7:59 AM

## 2017-01-05 NOTE — Progress Notes (Signed)
[facility] called about patient. She was ruled out as a candidate for donation. Spoke to [employer] Reference Number [id number]

## 2017-01-05 NOTE — Care Management Note (Signed)
Case Management Note  Patient Details  Name: EMOGENE MURATALLA MRN: [id number] Date of Birth: 09-Oct-1933  Subjective/Objective:  Adm with sepsis. Transitioned to comfort care yesterday.                Action/Plan: CM following for needs.    Expected Discharge Date:      12/15/2016            Expected Discharge Plan:     In-House Referral:     Discharge planning Services  CM Consult  Post Acute Care Choice:    Choice offered to:     DME Arranged:    DME Agency:     HH Arranged:    HH Agency:     Status of Service:  In process, will continue to follow  If discussed at Long Length of Stay Meetings, dates discussed:    Additional Comments:  Ica Daye, Chrystine Oiler, RN 12/23/2016, 9:55 AM

## 2017-01-05 NOTE — Discharge Summary (Signed)
Death Summary  Melanie Fields JWJ:[id number] DOB: 1933-10-27 DOA: 2016-12-20  PCP: Pearson Grippe, MD  Admit date: 12-20-2016 Date of Death: 12/23/16 Time of Death: 17.30pm. Notification: Pearson Grippe, MD notified of death of December 23, 2016  History of present illness:  Chief Complaint: Unresponsiveness  HPI: Melanie Fields is a 83 y.o. female with medical history significant of COPD on 2L home O2, HTN, DM, Liver cirrhosis-?etiology. History was obtained from daughter and granddaughter, patient is on BiPAP, and then to questions but awake, restless. Per family- patient's workup at about 6 AM this morning to use the Bathroom, is on the commode for about 90 minutes, without taking her oxygen along. Daughter checked on patient and now found her to be awake but not responding and could not move. Patient is on 2 L of oxygen, she on many occasions will not use O2.  Over the past week family reports patient has been seen it is her time to go, she has been progressively week over the past 2 weeks, daughter notes chills this morning, both denies fevers, dysuria or frequency. Patient shortness of breath, and nonproductive cough are stable.  Patient still smokes cigs, family denies alcohol history.  ED Course: In the ED patient's heart rate was in the 130s, in A. fib with RVR, ABG with pH of 7.06, PCO2- below reportable range, Po2- 38, troponin 0.25, CMP- CO2 9, AG-24, normal ammonia levels-34, negative UDS, UA- positive for many bacteria and nitrites. Head CT and chest x-ray negative for acute abnormality.  Final Diagnoses:   Sepsis from UTI, with acute on chronic respiratory failure form COPD- with significant lactic acidosis, marked dehydration with Acute kidney injury. Pt was aggresively hydrated, with IV bicarb infusion and Normal saline. Treated with IV antibiotics IV ceftriaxone was switched to IV meropenem. Urine cultures- Ecoli, pan sensitive, resistant to levaquin. Blood cultures- no growth. Pt was placed on high  flow Nasal canula, then switch to bipap with persistent hypoxia. Palliative was consulted. Family elected for comfort care.  NSTEMI, with Afib with RVR- No prior hx. Likely provoked by sepsis. Elevated troponin with regional wall motion abnormality on ECHO.  Cards consulted- patient not a candidate for anticoagulation. Anticoagulation started later d/c due to development of thrombocytopenia.   The results of significant diagnostics from this hospitalization (including imaging, microbiology, ancillary and laboratory) are listed below for reference.    Significant Diagnostic Studies: Ct Head Wo Contrast  Result Date: 20-Dec-2016 CLINICAL DATA:  Acute mental status change. EXAM: CT HEAD WITHOUT CONTRAST TECHNIQUE: Contiguous axial images were obtained from the base of the skull through the vertex without intravenous contrast. COMPARISON:  October 03, 2015 FINDINGS: Brain: No subdural, epidural, or subarachnoid hemorrhage. Cerebellum and basal cisterns are normal. Low attenuation in the pons is likely artifactual. Volume loss with prominence of the sulci is stable. The ventricles are unchanged. No mass, mass effect, or midline shift. Scattered white matter changes. No acute cortical ischemia or infarct. Vascular: No hyperdense vessel or unexpected calcification. Skull: Normal. Negative for fracture or focal lesion. Sinuses/Orbits: No acute finding. Other: None. IMPRESSION: Chronic white matter changes. No acute cortical ischemia or infarct. No bleed identified. Electronically Signed   By: Gerome Sam III M.D   On: 2016-12-20 11:15   Dg Chest Port 1 View  Result Date: 12/07/2016 CLINICAL DATA:  Short of breath EXAM: PORTABLE CHEST 1 VIEW COMPARISON:  December 20, 2016 FINDINGS: Progression of bibasilar airspace disease and small effusions. Negative for pulmonary edema. IMPRESSION: Progression of bibasilar atelectasis/ infiltrate  and bilateral effusions. Electronically Signed   By: Marlan Palau M.D.   On:  12/07/2016 08:55   Dg Chest Port 1 View  Result Date: 12-10-16 CLINICAL DATA:  Altered mental status, combative EXAM: PORTABLE CHEST 1 VIEW COMPARISON:  10/03/2015 FINDINGS: The lungs are hyperinflated likely secondary to COPD. There is no focal parenchymal opacity. There is no pleural effusion or pneumothorax. There is stable cardiomegaly. There is mild bilateral interstitial prominence unchanged in the prior exam. The osseous structures are unremarkable. IMPRESSION: No active disease. Electronically Signed   By: Elige Ko   On: 12/10/2016 08:57    Microbiology: Recent Results (from the past 240 hour(s))  Culture, blood (routine x 2)     Status: None (Preliminary result)   Collection Time: 10-Dec-2016  8:22 AM  Result Value Ref Range Status   Specimen Description RIGHT ANTECUBITAL  Final   Special Requests BOTTLES DRAWN AEROBIC AND ANAEROBIC 6CC EACH  Final   Culture NO GROWTH 3 DAYS  Final   Report Status PENDING  Incomplete  Culture, blood (routine x 2)     Status: None (Preliminary result)   Collection Time: 12-10-2016 12:17 PM  Result Value Ref Range Status   Specimen Description RIGHT ANTECUBITAL  Final   Special Requests BOTTLES DRAWN AEROBIC AND ANAEROBIC 6CC EACH  Final   Culture NO GROWTH 3 DAYS  Final   Report Status PENDING  Incomplete  MRSA PCR Screening     Status: None   Collection Time: 12/10/2016 12:54 PM  Result Value Ref Range Status   MRSA by PCR NEGATIVE NEGATIVE Final    Comment:        The GeneXpert MRSA Assay (FDA approved for NASAL specimens only), is one component of a comprehensive MRSA colonization surveillance program. It is not intended to diagnose MRSA infection nor to guide or monitor treatment for MRSA infections.   Urine culture     Status: Abnormal   Collection Time: 12/10/16  3:30 PM  Result Value Ref Range Status   Specimen Description URINE, CATHETERIZED  Final   Special Requests NONE  Final   Culture >=100,000 COLONIES/mL ESCHERICHIA  COLI (A)  Final   Report Status 12/07/2016 FINAL  Final   Organism ID, Bacteria ESCHERICHIA COLI (A)  Final      Susceptibility   Escherichia coli - MIC*    AMPICILLIN 4 SENSITIVE Sensitive     CEFAZOLIN <=4 SENSITIVE Sensitive     CEFTRIAXONE <=1 SENSITIVE Sensitive     CIPROFLOXACIN >=4 RESISTANT Resistant     GENTAMICIN <=1 SENSITIVE Sensitive     IMIPENEM <=0.25 SENSITIVE Sensitive     NITROFURANTOIN <=16 SENSITIVE Sensitive     TRIMETH/SULFA <=20 SENSITIVE Sensitive     AMPICILLIN/SULBACTAM <=2 SENSITIVE Sensitive     PIP/TAZO <=4 SENSITIVE Sensitive     Extended ESBL NEGATIVE Sensitive     * >=100,000 COLONIES/mL ESCHERICHIA COLI     Labs: Basic Metabolic Panel:  Recent Labs Lab 12-10-16 0822 12/06/16 0424 12/06/16 0908 12/07/16 0429  NA 137 141  --  136  K 3.7 3.2*  --  3.3*  CL 104 110  --  104  CO2 9* 20*  --  24  GLUCOSE 367* 76  --  154*  BUN 13 12  --  12  CREATININE 1.54* 0.85  --  0.93  CALCIUM 9.2 7.8*  --  7.9*  MG  --   --  1.5* 2.1   Liver Function Tests:  Recent Labs  Lab 11/10/2016 0822  AST 102*  ALT 15  ALKPHOS 73  BILITOT 0.8  PROT 6.5  ALBUMIN 3.4*    Recent Labs Lab 11/08/2016 0822  LIPASE 17    Recent Labs Lab 11/05/2016 0832  AMMONIA 34   CBC:  Recent Labs Lab 11/10/2016 0822 12/06/16 0424 12/06/16 0908 12/07/16 0951  WBC 11.1* 10.1 10.1 9.4  NEUTROABS 6.1  --   --   --   HGB 16.2* 15.2* 14.5 13.7  HCT 50.6* 45.7 42.9 40.6  MCV 98.4 93.6 92.7 92.9  PLT 118* 59* 66* 57*   Cardiac Enzymes:  Recent Labs Lab 11/08/2016 0822 11/11/2016 1705 11/16/2016 2238  TROPONINI 0.25* 0.60* 0.47*   CBG:  Recent Labs Lab 12/06/16 1937 12/07/16 0007 12/07/16 0314 12/07/16 0732 12/07/16 1110  GLUCAP 95 100* 142* 188* 132*   Urinalysis    Component Value Date/Time   COLORURINE YELLOW 11/24/2016 1015   APPEARANCEUR CLEAR 11/27/2016 1015   LABSPEC >1.030 (H) 12/01/2016 1015   PHURINE 6.0 12/04/2016 1015   GLUCOSEU 250 (A)  11/22/2016 1015   HGBUR LARGE (A) 11/12/2016 1015   BILIRUBINUR NEGATIVE 11/28/2016 1015   KETONESUR TRACE (A) 11/08/2016 1015   PROTEINUR 100 (A) 11/17/2016 1015   UROBILINOGEN 0.2 09/09/2014 1457   NITRITE POSITIVE (A) 12/02/2016 1015   LEUKOCYTESUR NEGATIVE 11/18/2016 1015   SIGNED:  Onnie Boer, MD  [facility] Jan 05, 2017, 7:47 PM Pager [phone]  If 7PM-7AM, please contact night-coverage [email or web address] Password [email or web address]

## 2017-01-05 NOTE — Progress Notes (Signed)
Daily Progress Note   Patient Name: Melanie Fields       Date: 12/28/2016 DOB: 03-Dec-1933  Age: 83 y.o. MRN#: [id number] Attending Physician: Onnie Boer, MD Primary Care Physician: Pearson Grippe, MD Admit Date: 11/06/2016  Reason for Consultation/Follow-up: Establishing goals of care, Psychosocial/spiritual support and Terminal Care  Subjective: Melanie Fields is lying in bed, surrounded by her family. She does not open eyes to touch or request. She has periods of moaning, PRN morphine not adequately managing symptoms at this time. Family is agreeable to morphine continuous infusion for comfort and dignity. Granddaughter, [name], states that her grandmother spoke to her this morning, but would not speak to anyone else. Family is preparing for Melanie Fields's passing. All questions and concerns answered. Working with nursing for symptom management.  Length of Stay: 3  Current Medications: Scheduled Meds:  . nicotine  14 mg Transdermal Daily  . nystatin   Topical BID  . potassium chloride  40 mEq Oral Once  . sodium bicarbonate  50 mEq Intravenous Once    Continuous Infusions: . sodium chloride 10 mL/hr at 28-Dec-2016 1041  . morphine 4 mg/hr (28-Dec-2016 1041)    PRN Meds: artificial tears, ipratropium-albuterol, ondansetron **OR** ondansetron (ZOFRAN) IV  Physical Exam  Constitutional: No distress.  Moaning, does not open eyes to touch or request  HENT:  Head: Normocephalic and atraumatic.  Cardiovascular: Normal rate.   Pulmonary/Chest:  Moaning, low saturation  Abdominal: Soft. She exhibits no distension.  Musculoskeletal: She exhibits no edema.  Skin: Skin is warm and dry.  Nursing note and vitals reviewed.           Vital Signs: BP 108/85   Pulse (!) 54   Temp 98.4 F (36.9  C) (Oral)   Resp (!) 23   Ht  (1.575 m)   Wt 58.5 kg (128 lb 15.5 oz)   SpO2 (!) 65%   BMI 23.59 kg/m  SpO2: SpO2: (!) 65 % O2 Device: O2 Device: Nasal Cannula O2 Flow Rate: O2 Flow Rate (L/min): 3 L/min  Intake/output summary:  Intake/Output Summary (Last 24 hours) at December 28, 2016 1200 Last data filed at Dec 28, 2016 0900  Gross per 24 hour  Intake           556.25 ml  Output  400 ml  Net           156.25 ml   LBM:   Baseline Weight: Weight: 58.5 kg (128 lb 15.5 oz) Most recent weight: Weight: 58.5 kg (128 lb 15.5 oz)       Palliative Assessment/Data:    Flowsheet Rows     Most Recent Value  Intake Tab  Referral Department  Hospitalist  Unit at Time of Referral  ICU  Palliative Care Primary Diagnosis  Pulmonary  Date Notified  12/07/16  Palliative Care Type  New Palliative care  Reason for referral  Clarify Goals of Care, End of Life Care Assistance  Date of Admission  11/08/2016  Date first seen by Palliative Care  12/07/16  # of days Palliative referral response time  0 Day(s)  # of days IP prior to Palliative referral  2  Clinical Assessment  Palliative Performance Scale Score  10%  Pain Max last 24 hours  Not able to report  Pain Min Last 24 hours  Not able to report  Dyspnea Max Last 24 Hours  Not able to report  Dyspnea Min Last 24 hours  Not able to report  Psychosocial & Spiritual Assessment  Palliative Care Outcomes  Patient/Family meeting held?  Yes  Who was at the meeting?  Daughter, Maudie Mercury, granddaughter [name], Niece Waynetta Sandy, grandson John  Palliative Care Outcomes  Changed to focus on comfort, Provided psychosocial or spiritual support, Provided end of life care assistance, Clarified goals of care  Patient/Family wishes: Interventions discontinued/not started   Mechanical Ventilation, BiPAP      Patient Active Problem List   Diagnosis Date Noted  . Goals of care, counseling/discussion   . Palliative care encounter   . Sepsis (HCC)  12/04/2016  . NSTEMI (non-ST elevated myocardial infarction) (HCC) 12/02/2016  . UTI (urinary tract infection) 11/17/2016  . Atrial fibrillation with rapid ventricular response (HCC) 11/24/2016  . Metabolic acidosis 12/02/2016  . COPD (chronic obstructive pulmonary disease) (HCC) 11/17/2016  . AKI (acute kidney injury) (HCC) 11/06/2016  . Hypoxemia 11/18/2016  . PLANTAR FACIITIS 02/20/2009  . PES PLANUS 02/20/2009  . CLOSED FRACTURE OF LATERAL MALLEOLUS 11/28/2008  . DIABETES 09/12/2007  . LOW BACK PAIN 09/12/2007  . SCIATICA 09/12/2007  . BACK PAIN 09/12/2007  . HIGH BLOOD PRESSURE 09/12/2007    Palliative Care Assessment & Plan   Patient Profile: 83 y.o. female  with past medical history of Back pain, diabetes, hypertension, COPD with, oxygen use, liver cirrhosis, admitted on 12/02/2016 with sepsis from UTI, a fib with RVR.  Assessment: Respiratory failure; family requests comfort measures only. They request no escalation in oxygen delivery, focus instead on comfort and dignity. Discomfort today, some moaning, morphine continuous infusion started.  Recommendations/Plan:  comfort measures only, let nature take its course, morphine continuous infusion started today.  Goals of Care and Additional Recommendations:  Limitations on Scope of Treatment: Full Comfort Care  Code Status:    Code Status Orders        Start     Ordered   11/18/2016 1452  Do not attempt resuscitation (DNR)  Continuous    Question Answer Comment  In the event of cardiac or respiratory ARREST Do not call a "code blue"   In the event of cardiac or respiratory ARREST Do not perform Intubation, CPR, defibrillation or ACLS   In the event of cardiac or respiratory ARREST Use medication by any route, position, wound care, and other measures to relive pain and suffering. May  use oxygen, suction and manual treatment of airway obstruction as needed for comfort.      11/10/2016 1452    Code Status History     Date Active Date Inactive Code Status Order ID Comments User Context   12/03/2016 10:12 AM 11/22/2016  2:52 PM DNR [id number]  Donnetta Hutching, MD ED       Prognosis:   Hours - Days  Discharge Planning:  Anticipated Hospital Death  Care plan was discussed with nursing staff, case manager, social worker and Dr. Mariea Clonts  Thank you for allowing the Palliative Medicine Team to assist in the care of this patient.   Time In: 0935 Time Out: 1000 Total Time 25 minutes Prolonged Time Billed  no       Greater than 50%  of this time was spent counseling and coordinating care related to the above assessment and plan.  Katheran Awe, NP  Please contact Palliative Medicine Team phone at [phone] for questions and concerns.

## 2017-01-05 DEATH — deceased
